# Patient Record
Sex: Male | Born: 1965 | Race: White | Hispanic: No | Marital: Married | Smoking: Former smoker
Health system: Southern US, Community
[De-identification: ages and names within clinical notes are randomized; demographics above are authoritative.]

## PROBLEM LIST (undated history)

## (undated) DIAGNOSIS — E785 Hyperlipidemia, unspecified: Secondary | ICD-10-CM

## (undated) DIAGNOSIS — R569 Unspecified convulsions: Secondary | ICD-10-CM

## (undated) DIAGNOSIS — R768 Other specified abnormal immunological findings in serum: Secondary | ICD-10-CM

## (undated) DIAGNOSIS — I639 Cerebral infarction, unspecified: Secondary | ICD-10-CM

## (undated) DIAGNOSIS — K219 Gastro-esophageal reflux disease without esophagitis: Secondary | ICD-10-CM

## (undated) DIAGNOSIS — G40909 Epilepsy, unspecified, not intractable, without status epilepticus: Secondary | ICD-10-CM

## (undated) DIAGNOSIS — Z8659 Personal history of other mental and behavioral disorders: Secondary | ICD-10-CM

## (undated) DIAGNOSIS — N289 Disorder of kidney and ureter, unspecified: Secondary | ICD-10-CM

## (undated) DIAGNOSIS — G510 Bell's palsy: Secondary | ICD-10-CM

## (undated) DIAGNOSIS — I10 Essential (primary) hypertension: Secondary | ICD-10-CM

## (undated) DIAGNOSIS — G43909 Migraine, unspecified, not intractable, without status migrainosus: Secondary | ICD-10-CM

## (undated) DIAGNOSIS — R6882 Decreased libido: Secondary | ICD-10-CM

## (undated) DIAGNOSIS — I671 Cerebral aneurysm, nonruptured: Secondary | ICD-10-CM

## (undated) HISTORY — DX: Hyperlipidemia, unspecified: E78.5

## (undated) HISTORY — DX: Epilepsy, unspecified, not intractable, without status epilepticus: G40.909

## (undated) HISTORY — PX: CEREBRAL ANGIOGRAM: SHX1326

## (undated) HISTORY — DX: Decreased libido: R68.82

## (undated) HISTORY — DX: Unspecified convulsions: R56.9

## (undated) HISTORY — DX: Essential (primary) hypertension: I10

## (undated) HISTORY — DX: Personal history of other mental and behavioral disorders: Z86.59

## (undated) HISTORY — PX: MANDIBLE FRACTURE SURGERY: SHX706

## (undated) HISTORY — PX: COLONOSCOPY: SHX174

## (undated) HISTORY — DX: Other specified abnormal immunological findings in serum: R76.8

---

## 2004-03-20 ENCOUNTER — Emergency Department: Payer: Self-pay | Admitting: Emergency Medicine

## 2004-07-29 ENCOUNTER — Emergency Department: Payer: Self-pay | Admitting: Emergency Medicine

## 2004-09-17 ENCOUNTER — Emergency Department: Payer: Self-pay | Admitting: Internal Medicine

## 2004-09-17 ENCOUNTER — Other Ambulatory Visit: Payer: Self-pay

## 2005-05-17 ENCOUNTER — Other Ambulatory Visit: Payer: Self-pay

## 2005-05-17 ENCOUNTER — Emergency Department: Payer: Self-pay | Admitting: Emergency Medicine

## 2005-11-19 ENCOUNTER — Emergency Department: Payer: Self-pay | Admitting: Emergency Medicine

## 2007-11-23 ENCOUNTER — Emergency Department: Payer: Self-pay | Admitting: Emergency Medicine

## 2010-10-05 ENCOUNTER — Ambulatory Visit: Payer: Self-pay | Admitting: Gastroenterology

## 2010-10-07 LAB — PATHOLOGY REPORT

## 2011-09-13 ENCOUNTER — Emergency Department: Payer: Self-pay | Admitting: Emergency Medicine

## 2012-05-03 ENCOUNTER — Telehealth: Payer: Self-pay

## 2012-05-03 ENCOUNTER — Emergency Department: Payer: Self-pay | Admitting: Emergency Medicine

## 2012-05-03 LAB — CBC
MCH: 31.3 pg (ref 26.0–34.0)
RBC: 4.6 10*6/uL (ref 4.40–5.90)
WBC: 6.7 10*3/uL (ref 3.8–10.6)

## 2012-05-03 LAB — CK TOTAL AND CKMB (NOT AT ARMC): CK, Total: 159 U/L (ref 35–232)

## 2012-05-03 LAB — TROPONIN I: Troponin-I: <0.02 ng/mL

## 2012-05-03 LAB — BASIC METABOLIC PANEL
EGFR (African American): >60
EGFR (Non-African Amer.): >60
Glucose: 87 mg/dL (ref 65–99)
Osmolality: 270 (ref 275–301)

## 2012-05-03 NOTE — Telephone Encounter (Signed)
Pt's wife called to say pt was in ER yesterday and was told to f/u with Dr. Mariah Milling tomm I spoke with Dr. Mariah Milling about this who says CP was very atypical and is ok to f/u in 1-2 weeks I will call pt to schedule

## 2012-05-03 NOTE — Telephone Encounter (Signed)
I called wife back She says she was told by ER MD to have pt f/u tomm with Dr. Mariah Milling I explained Dr. Mariah Milling did not feel tomm appt was necessary She verb understanding but is concerned b/c pt continues to have CP despite negative w/u in ER I worked pt in for appt with Dr. Mariah Milling Friday 1/17 at 3:15 Understanding verb

## 2012-05-05 ENCOUNTER — Ambulatory Visit (INDEPENDENT_AMBULATORY_CARE_PROVIDER_SITE_OTHER): Payer: BC Managed Care – PPO | Admitting: Cardiovascular Disease

## 2012-05-05 ENCOUNTER — Encounter: Payer: Self-pay | Admitting: Cardiovascular Disease

## 2012-05-05 VITALS — BP 120/80 | HR 67 | Ht 68.0 in | Wt 201.0 lb

## 2012-05-05 DIAGNOSIS — R079 Chest pain, unspecified: Secondary | ICD-10-CM

## 2012-05-05 DIAGNOSIS — R0602 Shortness of breath: Secondary | ICD-10-CM

## 2012-05-05 DIAGNOSIS — E785 Hyperlipidemia, unspecified: Secondary | ICD-10-CM

## 2012-05-05 DIAGNOSIS — Z87891 Personal history of nicotine dependence: Secondary | ICD-10-CM

## 2012-05-05 DIAGNOSIS — I10 Essential (primary) hypertension: Secondary | ICD-10-CM

## 2012-05-05 MED ORDER — FENOFIBRATE 160 MG PO TABS: 160.0000 mg | ORAL_TABLET | Freq: Every day | ORAL | Status: DC

## 2012-05-05 MED ORDER — LOVASTATIN 20 MG PO TABS: 20.0000 mg | ORAL_TABLET | Freq: Every day | ORAL | Status: DC

## 2012-05-05 MED ORDER — NITROGLYCERIN 0.4 MG SL SUBL: 0.4000 mg | SUBLINGUAL_TABLET | SUBLINGUAL | Status: DC | PRN

## 2012-05-05 NOTE — Progress Notes (Signed)
Patient ID: Hunter Weber, male    DOB: February 10, 1966, 47 y.o.   MRN: [id number]  HPI Comments: Mr. Hunter Weber is a very pleasant 38 year old gentleman with a 20 year smoking history who stopped 13 years ago with history of hypertension, hyperlipidemia, elevated triglycerides in the 500 range, depression, history of seizures who presents or ventilation of recent episodes of chest pain.  He reports that last week, proximally 4 days ago he had a short episode of chest pain while at work that was short in duration. The next day he had a second episode of left-sided chest pain it lasted approximately one hour. This was intense. He went to the emergency room for further evaluation.   Cardiac enzymes in the emergency room were negative x2. No significant EKG changes concerning for ischemia in the ER. Other workup was negative and he was discharged home with followup today.  No further episodes of chest pain since he was in the emergency room. He has not been back to work and has taken vacation. EKG shows normal sinus rhythm with rate 67 beats per minute with no significant ST or T wave changes Blood work from July 2013 shows total cholesterol 207, triglycerides 686, LDL 42, HDL 27  He reports that his brother recently died of a heart attack in his early 90s, father died of heart attack in his 22s. Both were long-time smokers   Outpatient Encounter Prescriptions as of 05/05/2012  Medication Sig Dispense Refill  . FLUoxetine (PROZAC) 20 MG tablet Take 20 mg by mouth daily.      Marland Kitchen levETIRAcetam (KEPPRA) 500 MG tablet Take 500 mg by mouth 2 (two) times daily.      Marland Kitchen losartan (COZAAR) 100 MG tablet Take 100 mg by mouth daily.      Marland Kitchen lovastatin (MEVACOR) 10 MG tablet Take 10 mg by mouth at bedtime.      . polyethylene glycol powder (MIRALAX) powder Take 17 g by mouth daily.      . nitroGLYCERIN (NITROSTAT) 0.4 MG SL tablet Place 1 tablet (0.4 mg total) under the tongue every 5 (five) minutes as needed for chest  pain.  25 tablet  6    Review of Systems  Constitutional: Negative.   HENT: Negative.   Eyes: Negative.   Respiratory: Negative.   Cardiovascular: Positive for chest pain.  Gastrointestinal: Negative.   Musculoskeletal: Negative.   Skin: Negative.   Neurological: Negative.   Hematological: Negative.   Psychiatric/Behavioral: Negative.   All other systems reviewed and are negative.    BP 120/80  Pulse 67  Ht 5\' 8"  (1.727 m)  Wt 201 lb (91.173 kg)  BMI 30.56 kg/m2  Physical Exam  Nursing note and vitals reviewed. Constitutional: He is oriented to person, place, and time. He appears well-developed and well-nourished.  HENT:  Head: Normocephalic.  Nose: Nose normal.  Mouth/Throat: Oropharynx is clear and moist.  Eyes: Conjunctivae normal are normal. Pupils are equal, round, and reactive to light.  Neck: Normal range of motion. Neck supple. No JVD present.  Cardiovascular: Normal rate, regular rhythm, S1 normal, S2 normal, normal heart sounds and intact distal pulses.  Exam reveals no gallop and no friction rub.   No murmur heard. Pulmonary/Chest: Effort normal and breath sounds normal. No respiratory distress. He has no wheezes. He has no rales. He exhibits no tenderness.  Abdominal: Soft. Bowel sounds are normal. He exhibits no distension. There is no tenderness.  Musculoskeletal: Normal range of motion. He exhibits no edema and  no tenderness.  Lymphadenopathy:    He has no cervical adenopathy.  Neurological: He is alert and oriented to person, place, and time. Coordination normal.  Skin: Skin is warm and dry. No rash noted. No erythema.  Psychiatric: He has a normal mood and affect. His behavior is normal. Judgment and thought content normal.           Assessment and Plan

## 2012-05-05 NOTE — Assessment & Plan Note (Signed)
We have complemented him on his smoking cessation over 10 years ago.

## 2012-05-05 NOTE — Assessment & Plan Note (Signed)
Cholesterol is at goal on the current lipid regimen. No changes to the medications were made.  

## 2012-05-05 NOTE — Assessment & Plan Note (Signed)
Recent episodes of atypical chest pain. He does have several risk factors and a very strong family history. We have discussed the options with him and we will continue aggressive cholesterol/lipid management. Treadmill stress test will be ordered and done today.

## 2012-05-05 NOTE — Patient Instructions (Addendum)
Stress test was normal with good exercise tolerance, no ischemia No further testing needed at this time  Increase her lovastatin to 20 mg daily, start fenofibrate 1 tab daily Start aspirin 81 mg daily If you have additional episodes of chest pain, please call the office

## 2012-05-05 NOTE — Assessment & Plan Note (Signed)
Cholesterol continues to be mildly elevated, triglycerides very high. We have suggested he increase his lovastatin to 20 mg daily and start fenofibrate 160 mg daily for elevated triglycerides. We have encouraged him to continue on his fish oil.

## 2012-05-05 NOTE — Procedures (Signed)
Exercise Treadmill Test  Treadmill ordered for recent epsiodes of chest pain.  Resting EKG shows NSR with rate of 64 bpm, no significant ST or T wave changes Resting blood pressure of 120/80. Stand bruce protocal was used.  Patient exercised for 8 min 04 sec,  Peak heart rate of 186 bpm.  This was 90% of the maximum predicted heart rate (target heart rate 150). Achieved 10.1 METS No symptoms of chest pain or lightheadedness were reported at peak stress or in recovery.  Peak Blood pressure recorded was 188/84 Heart rate at 3 minutes in recovery was  93 bpm.  FINAL IMPRESSION: Normal exercise stress test. No significant EKG changes concerning for ischemia. Excellent exercise tolerance.

## 2012-05-05 NOTE — Patient Instructions (Addendum)
You are doing well. Please increase the lovastatin to 20 mg daily for cholesterol Please start fenofibrate one a day for high triglycerides Continue aspirin 81 mg daily  We will do a treadmill today for chest pain  Please call us if you have new issues that need to be addressed before your next appt.  Followup in 1 year Please call the office for worsening chest pain

## 2013-05-07 ENCOUNTER — Other Ambulatory Visit: Payer: Self-pay

## 2013-05-07 MED ORDER — FENOFIBRATE 160 MG PO TABS: 160.0000 mg | ORAL_TABLET | Freq: Every day | ORAL | Status: DC

## 2013-05-07 NOTE — Telephone Encounter (Signed)
Refill sent for fenofibrate 160 mg take one tablet daily.

## 2013-12-31 DIAGNOSIS — K219 Gastro-esophageal reflux disease without esophagitis: Secondary | ICD-10-CM | POA: Insufficient documentation

## 2013-12-31 DIAGNOSIS — G40209 Localization-related (focal) (partial) symptomatic epilepsy and epileptic syndromes with complex partial seizures, not intractable, without status epilepticus: Secondary | ICD-10-CM | POA: Insufficient documentation

## 2013-12-31 DIAGNOSIS — F329 Major depressive disorder, single episode, unspecified: Secondary | ICD-10-CM | POA: Insufficient documentation

## 2013-12-31 DIAGNOSIS — F32A Depression, unspecified: Secondary | ICD-10-CM | POA: Insufficient documentation

## 2014-03-18 ENCOUNTER — Emergency Department: Payer: Self-pay | Admitting: Emergency Medicine

## 2014-03-18 LAB — BASIC METABOLIC PANEL
ANION GAP: 5 — AB (ref 7–16)
BUN: 17 mg/dL (ref 7–18)
CHLORIDE: 105 mmol/L (ref 98–107)
CO2: 29 mmol/L (ref 21–32)
CREATININE: 1.67 mg/dL — AB (ref 0.60–1.30)
Calcium, Total: 9.1 mg/dL (ref 8.5–10.1)
GFR CALC AF AMER: 56 — AB
GFR CALC NON AF AMER: 47 — AB
GLUCOSE: 92 mg/dL (ref 65–99)
Osmolality: 279 (ref 275–301)
Potassium: 4 mmol/L (ref 3.5–5.1)
Sodium: 139 mmol/L (ref 136–145)

## 2014-03-18 LAB — CBC WITH DIFFERENTIAL/PLATELET
BASOS ABS: 0.1 10*3/uL (ref 0.0–0.1)
Basophil %: 0.7 %
EOS PCT: 1.1 %
Eosinophil #: 0.1 10*3/uL (ref 0.0–0.7)
HCT: 44.3 % (ref 40.0–52.0)
HGB: 15.3 g/dL (ref 13.0–18.0)
LYMPHS ABS: 2 10*3/uL (ref 1.0–3.6)
Lymphocyte %: 23.5 %
MCH: 32 pg (ref 26.0–34.0)
MCHC: 34.5 g/dL (ref 32.0–36.0)
MCV: 93 fL (ref 80–100)
Monocyte #: 0.7 x10 3/mm (ref 0.2–1.0)
Monocyte %: 8.6 %
NEUTROS ABS: 5.5 10*3/uL (ref 1.4–6.5)
Neutrophil %: 66.1 %
PLATELETS: 189 10*3/uL (ref 150–440)
RBC: 4.78 10*6/uL (ref 4.40–5.90)
RDW: 13.1 % (ref 11.5–14.5)
WBC: 8.4 10*3/uL (ref 3.8–10.6)

## 2014-04-22 ENCOUNTER — Ambulatory Visit: Payer: Self-pay | Admitting: Neurology

## 2014-05-22 DIAGNOSIS — N183 Chronic kidney disease, stage 3 unspecified: Secondary | ICD-10-CM | POA: Insufficient documentation

## 2014-08-14 ENCOUNTER — Inpatient Hospital Stay
Admit: 2014-08-14 | Disposition: A | Discharge status: Home / Self Care | Payer: Self-pay | Attending: Internal Medicine | Admitting: Internal Medicine

## 2014-08-14 DIAGNOSIS — I639 Cerebral infarction, unspecified: Secondary | ICD-10-CM

## 2014-08-14 LAB — COMPREHENSIVE METABOLIC PANEL
ALBUMIN: 4.4 g/dL
ALK PHOS: 56 U/L
ANION GAP: 6 — AB (ref 7–16)
AST: 25 U/L
BUN: 14 mg/dL
Bilirubin,Total: 0.8 mg/dL
CHLORIDE: 105 mmol/L
Calcium, Total: 9.3 mg/dL
Co2: 28 mmol/L
Creatinine: 1.47 mg/dL — ABNORMAL HIGH
EGFR (African American): >60
EGFR (Non-African Amer.): 55 — ABNORMAL LOW
Glucose: 81 mg/dL
Potassium: 3.8 mmol/L
SGPT (ALT): 22 U/L
Sodium: 139 mmol/L
Total Protein: 7.1 g/dL

## 2014-08-14 LAB — APTT: Activated PTT: 31 secs (ref 23.6–35.9)

## 2014-08-14 LAB — CBC WITH DIFFERENTIAL/PLATELET
BASOS PCT: 0.6 %
Basophil #: 0 10*3/uL (ref 0.0–0.1)
EOS PCT: 1.8 %
Eosinophil #: 0.1 10*3/uL (ref 0.0–0.7)
HCT: 43.7 % (ref 40.0–52.0)
HGB: 15.2 g/dL (ref 13.0–18.0)
LYMPHS ABS: 2.2 10*3/uL (ref 1.0–3.6)
LYMPHS PCT: 28.3 %
MCH: 31 pg (ref 26.0–34.0)
MCHC: 34.9 g/dL (ref 32.0–36.0)
MCV: 89 fL (ref 80–100)
MONO ABS: 0.8 x10 3/mm (ref 0.2–1.0)
Monocyte %: 10.6 %
NEUTROS PCT: 58.7 %
Neutrophil #: 4.5 10*3/uL (ref 1.4–6.5)
Platelet: 198 10*3/uL (ref 150–440)
RBC: 4.91 10*6/uL (ref 4.40–5.90)
RDW: 13.3 % (ref 11.5–14.5)
WBC: 7.7 10*3/uL (ref 3.8–10.6)

## 2014-08-14 LAB — URINALYSIS, COMPLETE
BACTERIA: NONE SEEN
Bilirubin,UR: NEGATIVE
Blood: NEGATIVE
GLUCOSE, UR: NEGATIVE mg/dL (ref 0–75)
Ketone: NEGATIVE
LEUKOCYTE ESTERASE: NEGATIVE
Nitrite: NEGATIVE
PROTEIN: NEGATIVE
Ph: 6 (ref 4.5–8.0)
RBC,UR: NONE SEEN /HPF (ref 0–5)
Specific Gravity: 1.014 (ref 1.003–1.030)
Squamous Epithelial: NONE SEEN
WBC UR: NONE SEEN /HPF (ref 0–5)

## 2014-08-14 LAB — PROTIME-INR
INR: 1.2
Prothrombin Time: 14.9 secs

## 2014-08-14 LAB — TROPONIN I: Troponin-I: <0.03 ng/mL

## 2014-08-18 NOTE — Consult Note (Addendum)
PATIENT NAME:  Hunter Weber, Hunter Weber MR#:  [id number] DATE OF BIRTH:  07-22-1965  DATE OF CONSULTATION:  08/15/2014  REFERRING PHYSICIAN:   CONSULTING PHYSICIAN:  Leotis Pain, MD  REASON FOR CONSULTATION: Left facial numbness and numbness in the first 2 fingers of his left upper extremity.   HISTORY OF PRESENT ILLNESS: This is a 49-old gentleman recently discharged from [facility] for history of diffuse pressure-like headache. He was admitted there with rule out subarachnoid hemorrhage, status post multiple interventions including angiogram, lumbar puncture and CT. At that time, imaging only showed left MCA bifurcation 3 mm aneurysm. No signs of a subarachnoid hemorrhage. The patient's headache has resolved, and the patient was discharged home. Soon after discharge, the patient was found to have numbness on the left side of his lip and his 2 fingers, on his thumb and his forefinger, on the left upper extremity. Symptoms still persisted. Status post MRI, the patient has a right PCA/right MCA infarct, but the patient has a fetal PCA meaning it is coming from anterior circulation.  PAST MEDICAL HISTORY: Reviewed. Past medical history of seizure disorder, hypertension, and depression.   HOME MEDICATIONS: Have been reviewed, on Keppra 1 gram daily.  REVIEW OF SYSTEMS: No shortness of breath. No chest pain. No abdominal pain. Positive numbness on the left side as described above. Positive history of depression. No heat or cold intolerance.  IMAGING: As described above, right PCA/right MCA infarct, but they all come from the same anterior circulation.   LABORATORY WORKUP: Has been reviewed.  NEUROLOGIC EVALUATION: The patient is alert, awake and oriented to time, place, location and the reason why he is in the supper. Facial sensation decreased on the left side of the face. Motor is intact. Tongue is midline. Uvula elevates symmetrically. Shoulder shrug intact. Motor strength 5/5 bilateral upper and lower  extremities. Slight decreased sensation on the first 2 fingers on the left upper extremity. Coordination: Finger-to-nose intact. Gait: The patient is able to ambulate without any assistance.   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 His right posterior cerebral artery is fetal origin, meaning it is coming from anterior circulation, so I do not think this stroke is cardioembolic in nature.   PLAN: Aspirin to be started. PT/OT and likely discharge planning. I do not think he needs any further imaging. His seizures are well controlled. He is on Keppra a gram a twice a day.   This case was discussed with the patient and the patient's wife who was at bedside. Thank you, it was a pleasure seeing this patient.    ____________________________ Leotis Pain, MD yz:TM D: 08/15/2014 13:41:50 ET T: 08/15/2014 15:47:30 ET JOB#: [id number]  cc: Leotis Pain, MD, <Dictator> Leotis Pain MD ELECTRONICALLY SIGNED 09/05/2014 17:14

## 2014-08-18 NOTE — H&P (Addendum)
PATIENT NAME:  Hunter Weber, Hunter Weber MR#:  [id number] DATE OF BIRTH:  09/11/1965  DATE OF ADMISSION:  08/14/2014  PRIMARY CARE PHYSICIAN:  Dr. Ellison Hughs.    EMERGENCY ROOM PHYSICIAN:  Dr. Reita Cliche.   CHIEF COMPLAINT: Left side facial numbness.   HISTORY OF PRESENT ILLNESS: The patient is a 49 year old male brought in because of left facial numbness mainly involving the left upper lip and lower lip. The patient's symptoms started yesterday. The patient had a cerebral angiogram at [facility] and the symptoms started after the angiogram and symptoms started in the recovery room.  The  patient had severe headache a week ago, went to [facility], had CT of the head, LP, which were negative and he also had MRI of the brain which showed around 3 mm aneurysm which was incidental finding and  located at left MCA bifurcation. For followup of that he had an angiogram yesterday and in the recovery room he felt numbness of the left face and left oral area. The patient was discharged from there and the patient continued to have numbness on the left upper and lower lip and weak all over the body. Concerning this he was brought in here. His girlfriend also noticed that he was having some slurred speech. No headache. No blurred vision. No weakness of hands or legs in addition to like his regular weakness. Head CT showed right thalamus infarct.  Dr. Reita Cliche spoke with Dr. Mina Marble at [facility]. He suggested that because the aneurysm is only 3 mm there  is no treatment recommended and he suggested to treat it like a regular stroke.  He said if the aneurysm is more than 5 mm or greater then the patient needs surgery, otherwise he will not need surgery and he needs regular scheduled followups. The patient denies headache at this time, and has only numbness in the upper lip and lower lip on the left side.   PAST MEDICAL HISTORY: Significant for seizure disorder, hypertension, and depression.    MEDICATIONS:  Fenofibrate 150 mg p.o. daily, fluoxetine 20 mg daily,  krill oil 1 capsule daily, Keppra 1 gram p.o. b.i.d., losartan 100 mg p.o. daily, lovastatin 10 mg p.o. daily, Zyrtec 1 tablet daily.   PAST SURGICAL HISTORY: None.   SOCIAL HISTORY: The patient does not smoke. He drinks heavy on the weekends. No drugs.  FAMILY HISTORY: Significant for brothers and sisters who have heart disease.   MEDICATIONS:  Fenofibrate 150 mg p.o. daily, fluoxetine 20 mg p.o. daily, krill oil 1 capsule p.o. daily, Keppra 1 gram p.o. b.i.d., losartan 100 mg p.o. daily, lovastatin 10 mg p.o. daily, Zyrtec D 1 tablet daily.   REVIEW OF SYSTEMS:    CONSTITUTIONAL: No fever. No fatigue.   EYES:  The patient has no blurred vision.  Has history of right facial palsy years ago and has no residual deficits.  EARS, NOSE, AND THROAT: No tinnitus. No epistaxis. No difficulty swallowing.  RESPIRATIONS: No cough. No wheezing.  CARDIOVASCULAR:  No chest pain.  No orthopnea. No PND.  GASTROINTESTINAL: No nausea. No vomiting. No abdominal pain.  GENITOURINARY: No dysuria.  ENDOCRINE: No polyuria or nocturia.   INTEGUMENTARY: No skin rash.  MUSCULOSKELETAL: No joint pain.  NEUROLOGIC: The patient has numbness in the perioral area on the left side upper and lower lip. No weakness noted. The patient has no dysarthria. History of epilepsy present.  No tremors. No dementia. Had headache a week ago. No migraines.  PSYCHIATRIC:  Has a history of depression.  PHYSICAL EXAMINATION:  VITAL SIGNS: Temperature 98.3 Fahrenheit, heart rate 85, blood pressure 122/88, saturations 99% on room air.  GENERAL: Well-developed, well-nourished male, not in distress.  HEAD: Normocephalic, atraumatic.  EYES: Pupils equal, reacting to light. Extraocular movements are intact.  NOSE:  No nasal lesions. No drainage.  EARS: No drainage.   MOUTH:  No lesions.  NECK: Supple. No JVD. No carotid bruit. Thyroid in the midline.  RESPIRATORY: Good respiratory effort. Clear to auscultation. No wheeze noted.   CARDIOVASCULAR: S1, S2 regular. No murmurs. The patient has no peripheral edema.  GASTROINTESTINAL: Abdomen is soft, nontender, nondistended. Bowel sounds present.  EXTREMITIES: No extremity edema. No cyanosis, no clubbing.  MUSCULOSKELETAL: The patient has no pathology of digits or nails and strength and tone are equal bilaterally.  NEUROLOGIC: The patient is alert, awake, oriented. Cranial nerves II through XII intact. Power 5/5 upper and lower extremities. Sensation, decreased sensation on the left side of upper and lower extremities lips, but the sensations in the rest of the areas are normal to touch. Power 5 out of 5 in the upper and lower extremities. No hemiplegia. Cerebellar with finger-nose test is intact.   PSYCHIATRIC: Oriented to time, place, person.   LABORATORY DATA:  CT head shows recent infarct in the superior right thalamus. Troponin less than 0.03. Electrolytes, sodium 139, potassium is 3.8, chloride is 105, bicarbonate 28, BUN 14, creatinine 1.47, glucose 81.   CBC, WBCs 7.7, hemoglobin 15.2, hematocrit 43.7, platelets 198,000.   EKG, normal sinus rhythm at 81 beats per minute, no ST-T changes.   ASSESSMENT AND PLAN:  1.  The patient is a 49 year old male with acute right thalamus infarct causing left oral numbness.  The patient is admitted to medical service. Get ultrasound and echocardiogram, start him on aspirin, and check fasting lipids as well.  2.  Recent severe headache evaluated extensively at [facility] including CT head, MRI of the brain, cerebral angiogram. The patient has a 3 mm aneurysm and Dr. Mina Marble, the neurologist has been contacted and he suggested that we should treat it like acute stroke, so the patient will be admitted here. We will check ultrasound of the carotids and echocardiogram and follow stroke protocol. The patient is out of 90 minute window so we will continue aspirin alone. The family is aware of all the treatment plan.  We will get records of MRI of the  brain that was done a week ago and we will check MRI of the brain again and see.  Get neurological checks and obtain neurology consult.  3.  History of seizures. Continue him on Keppra.  4.  History of depression. Continue fluoxetine.  5.  History of hyperlipidemia. Continue Tricor and check fasting lipids.  6.  Hypertension. The patient is admitted for acute stroke, and allow permissive hypertension with blood pressure of 220/110 and hold off on losartan at this time.   TIME SPENT: 55 minutes. Discussed the plan with the patient's family.      ____________________________ Epifanio Lesches, MD sk:bu D: 08/14/2014 16:29:39 ET T: 08/14/2014 16:52:03 ET JOB#: [id number]  cc: Epifanio Lesches, MD, <Dictator> Epifanio Lesches MD ELECTRONICALLY SIGNED 08/20/2014 23:28

## 2014-08-18 NOTE — Discharge Summary (Signed)
Dates of Admission and Diagnosis:  Date of Admission 14-Aug-2014   Date of Discharge 15-Aug-2014   Admitting Diagnosis left numbness   Final Diagnosis 1. Acute Cerebrovascular accident Right thalamus and left thalamus. Possibly post-angiogram 2. Left MCA aneurysm. 3. hypertension  4. Seizures 5. Depression    Chief Complaint/History of Present Illness HISTORY OF PRESENT ILLNESS: This is a 48-old gentleman recently discharged from [facility] for history of diffuse pressure-like headache. He was admitted there with rule out subarachnoid hemorrhage, status post multiple interventions including angiogram, lumbar puncture and CT. At that time, imaging only showed left MCA bifurcation 3 mm aneurysm. No signs of a subarachnoid hemorrhage. The patient's headache has resolved, and the patient was discharged home. Soon after discharge, the patient was found to have numbness on the left side of his lip and his 2 fingers, on his thumb and his forefinger, on the left upper extremity. Symptoms still persisted. Status post MRI, the patient has a right PCA/right MCA infarct, but the patient has a fetal PCA meaning it is coming from anterior circulation   Allergies:  Penicillin: Anaphylaxis  Lisinopril: Headaches  Pertinent Past History:  Pertinent Past History hypertension Seizure Depression 3 mm L MCA aneurysm   Hospital Course:  Hospital Course Admitted to r/o Cerebrovascular accident. CT head was normal. MRI brain showed acute cva of Left and R thalamus. Possibility of this being a stroke from recent arteriogram at [facility]. Started on ASA, Statin Counselled. Seen by neurology. Echo and carotids did not show anything significant. No focal weakness and did not need PT/OT> Symptoms almost resolved and discharged home. Needs OP f/u with his neurologist for MCA naeurysm f/u.  Time spent on d/c 40 minutes   Condition on Discharge Fair   Code Status:  Code Status Full Code   PHYSICAL EXAM ON  DISCHARGE:  Physical Exam:  GEN thin   NECK supple  No masses   RESP normal resp effort  clear BS   NEURO negative rigidity, negative tremor, motor/sensory function intact   DISCHARGE INSTRUCTIONS HOME MEDS:  Medication Reconciliation: Patient's Home Medications at Discharge:     Medication Instructions  fluoxetine 20 mg oral capsule  1 cap(s) orally once a day (at bedtime)   losartan 100 mg oral tablet  1 tab(s) orally once a day (at bedtime)   fenofibrate 160 mg oral tablet  1 tab(s) orally once a day (at bedtime)   krill oil- capsule  1 cap(s) orally once a day (in the morning)   levetiracetam 1000 mg oral tablet  1 tab(s) orally 2 times a day   lovastatin 10 mg oral tablet  1 tab(s) orally once a day (at bedtime)   zyrtec-d  1 tab(s) orally once a day (in the morning)   aspirin 81 mg oral delayed release tablet  1 tab(s) orally once a day     Physician's Instructions:  Diet Low Sodium  Low Fat, Low Cholesterol   Activity Limitations As tolerated   Return to Work Not Applicable   Time frame for Follow Up Appointment 2-4 weeks  Dr. Manuella Ghazi( neurology)   Time frame for Follow Up Appointment 1-2 weeks  primary care provider   Electronic Signatures: Hunter Weber, Hunter Weber (MD)  (Signed 29-Apr-16 16:27)  Authored: ADMISSION DATE AND DIAGNOSIS, CHIEF COMPLAINT/HPI, Allergies, PERTINENT PAST HISTORY, HOSPITAL COURSE, PHYSICAL EXAM ON DISCHARGE, DISCHARGE INSTRUCTIONS HOME MEDS, PATIENT INSTRUCTIONS   Last Updated: 29-Apr-16 16:27 by Alba Destine (MD)

## 2014-08-29 DIAGNOSIS — I671 Cerebral aneurysm, nonruptured: Secondary | ICD-10-CM | POA: Insufficient documentation

## 2014-08-29 DIAGNOSIS — I63511 Cerebral infarction due to unspecified occlusion or stenosis of right middle cerebral artery: Secondary | ICD-10-CM | POA: Insufficient documentation

## 2014-09-04 ENCOUNTER — Encounter: Payer: Self-pay | Admitting: Physical Therapy

## 2014-09-04 ENCOUNTER — Ambulatory Visit: Payer: BLUE CROSS/BLUE SHIELD | Attending: Neurology | Admitting: Physical Therapy

## 2014-09-04 DIAGNOSIS — R278 Other lack of coordination: Secondary | ICD-10-CM

## 2014-09-04 DIAGNOSIS — R531 Weakness: Secondary | ICD-10-CM | POA: Diagnosis not present

## 2014-09-04 DIAGNOSIS — R279 Unspecified lack of coordination: Secondary | ICD-10-CM | POA: Diagnosis not present

## 2014-09-04 NOTE — Addendum Note (Signed)
Addended by: Alanson Puls on: 09/04/2014 02:22 PM   Modules accepted: Orders

## 2014-09-04 NOTE — Therapy (Addendum)
[place] MAIN [facility] SERVICES [address] [place], [place], [address] Phone: [phone]   Fax:  [phone]  Physical Therapy Evaluation  Patient Details  Name: Hunter Weber MRN: [id number] Date of Birth: 1965/09/05 Referring Provider:  Vladimir Crofts, MD  Encounter Date: 09/04/2014      PT End of Session - 09/04/14 1126    Visit Number 1   Number of Visits 9   Date for PT Re-Evaluation 10/30/14   PT Start Time 0905   PT Stop Time 0955   PT Time Calculation (min) 50 min   Activity Tolerance Patient tolerated treatment well   Behavior During Therapy [facility] for tasks assessed/performed      Past Medical History  Diagnosis Date  . Hyperlipidemia   . Hypertension   . Other convulsions   . Seizure disorder   . History of depression   . Low libido   . Positive ANA (antinuclear antibody)     Past Surgical History  Procedure Laterality Date  . Mandible fracture surgery      There were no vitals filed for this visit.  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            [facility] PT Assessment - 09/04/14 0001    Assessment   Medical Diagnosis CVA   Onset Date 08/13/14   Next MD Visit june 6 , 2016   Prior Therapy n   Precautions   Precautions None   Restrictions   Weight Bearing Restrictions No   Other Position/Activity Restrictions na   Balance Screen   Has the patient fallen in the past 6 months No   Has the patient had a decrease in activity level because of a fear of falling?  Yes   Is the patient reluctant to leave their home because of a fear of falling?  No   Home Environment   Living Enviornment Private residence   Living Arrangements Spouse/significant other   Available Help at Discharge Available 24 hours/day   Type of [place] Access Level entry   [place] None   Prior Function   Level of Independence Independent with basic ADLs   Vocation Full time employment   Vocation Requirements standing all day   Leisure na   Cognition   Overall Cognitive Status Impaired/Different from baseline   Area of Impairment Safety/judgement   Sensation   Light Touch Impaired Detail   Light Touch Impaired Details --  Right index finger and thumb impaired light and deep    Additional Comments --  Patient has numbness on tip of tung and and left side of fac   Coordination   Finger Nose Finger Test --  impaired   Heel Shin Test impaired   9 Hole Peg Test --  25.08 sec right , 41.50 sec   Functional Tests   Functional tests Sit to Stand   Sit to Stand   Comments s to s is 12.27   Tone   Assessment Location --  WFL   ROM / Strength   AROM / PROM / Strength AROM  4/5 l hip,knee ankle, shoulder,forearm sup/pron,wrist 3/5   AROM   Overall AROM  --  32 right grip, 22 left grip   Ambulation/Gait  Gait Comments --  Pt has slow gait speed and occassional foot drop   6 Minute Walk- Baseline   6 Minute Walk- Baseline yes  1475 feet   Static Standing Balance   Static Standing - Comment/# of Minutes --  Patient is unable to perform static tandem standing   Standardized Balance Assessment   Standardized Balance Assessment Timed Up and Go Test  see functional sit to stand test      gait speed 10 MW 1.31 m/sec                     PT Education - 09/04/14 1125    Education provided Yes   Person(s) Educated Patient   Methods Explanation;Demonstration;Tactile cues   Comprehension Returned demonstration;Verbalized understanding;Verbal cues required             PT Long Term Goals - 09/04/14 1409    PT LONG TERM GOAL #1   Title Patient will be independent in home exercise program to improve strength/mobility for better functional independence with ADLs 10/30/14   Status New   PT LONG TERM GOAL #2   Title   Patient (< 36 years old) will complete five times sit to stand test in < 10 seconds indicating an increased LE strength and improved balance 10/30/14   Status New   PT LONG TERM GOAL #3   Title Patient will tolerate 5 seconds of tandem stand stance without loss of balance to improve ability to get in and out of shower safely 10/30/14   Status New               Plan - 09/04/14 1127    Clinical Impression Statement This 49 yr old male prsents with Dx of CVA and has deficits including  decreasead :sensation,strenghth,  coordination of LLE and LUE, dynamic stantding balance, and impaired gait. He will benefit from skilled PT to improve balance, strenght and gait and coordination.    Pt will benefit from skilled therapeutic intervention in order to improve on the following deficits Abnormal gait;Decreased coordination;Difficulty walking;Decreased endurance;Decreased safety awareness;Impaired sensation;Decreased balance;Impaired UE functional use;Decreased mobility;Decreased strength   Rehab Potential Good   PT Frequency 2x / week   PT Duration 8 weeks   PT Treatment/Interventions Gait training;Neuromuscular re-education;Stair training;Patient/family education;Therapeutic activities;Cryotherapy;Therapeutic exercise;Electrical Stimulation;Manual techniques;Moist Heat;Balance training   PT Next Visit Plan balance and strength training   PT Home Exercise Plan instructed in hand putty exericses   Consulted and Agree with Plan of Care Patient;Family member/caregiver         Problem List Patient Active Problem List   Diagnosis Date Noted  . Chest pain 05/05/2012  . Hyperlipidemia 05/05/2012  . Essential hypertension 05/05/2012  . Smoking history 05/05/2012    Alanson Puls 09/04/2014, 2:17 PM  [place] MAIN [facility] SERVICES [address] [place], [place], [address] Phone: [phone]   Fax:  [phone]

## 2014-09-09 ENCOUNTER — Ambulatory Visit: Payer: BLUE CROSS/BLUE SHIELD | Admitting: Physical Therapy

## 2014-09-09 ENCOUNTER — Encounter: Payer: Self-pay | Admitting: Physical Therapy

## 2014-09-09 DIAGNOSIS — R279 Unspecified lack of coordination: Secondary | ICD-10-CM | POA: Diagnosis not present

## 2014-09-09 DIAGNOSIS — R278 Other lack of coordination: Secondary | ICD-10-CM

## 2014-09-09 NOTE — Therapy (Signed)
[place] MAIN [facility] SERVICES [address] [place], [place], [address] Phone: [phone]   Fax:  [phone]  Physical Therapy Treatment  Patient Details  Name: Hunter Weber MRN: [id number] Date of Birth: [birth year] Referring Provider:  Vladimir Crofts, MD  Encounter Date: 09/09/2014      PT End of Session - 09/09/14 1520    Visit Number 2   Number of Visits 10   Date for PT Re-Evaluation 10/30/14   PT Start Time 0305   PT Stop Time 0350   PT Time Calculation (min) 45 min   Activity Tolerance Patient tolerated treatment well   Behavior During Therapy [facility] for tasks assessed/performed      Past Medical History  Diagnosis Date  . Hyperlipidemia   . Hypertension   . Other convulsions   . Seizure disorder   . History of depression   . Low libido   . Positive ANA (antinuclear antibody)     Past Surgical History  Procedure Laterality Date  . Mandible fracture surgery      There were no vitals filed for this visit.  Visit Diagnosis:  Decreased coordination      Subjective Assessment - 09/09/14 1518    Subjective Patient says that he is doing ok.       Coordination exercises for LUE including shuffling cards, and dealing cards, picking up round dowles with gripper and managing small pieces of odd shapped objects and getting them into wholes. Patient was seen for high level balance activities including tandem standing on 1/2 foam catching a ball and tossing balloon. Continues to have balance deficits typical with diagnosis. Patient performs intermediate level exercises without pain behaviors and needs verbal cuing for postural alignment and head positioning                           PT Education - 09/09/14 1519    Education provided Yes   Education Details HEP progression with theraputty and coordination hand exercises.   Person(s) Educated Patient   Methods Explanation;Demonstration;Tactile cues   Comprehension Returned demonstration;Verbal cues required             PT Long Term Goals - 09/04/14 1409    PT LONG TERM GOAL #1   Title Patient will be independent in home exercise program to improve strength/mobility for better functional independence with ADLs 10/30/14   Status New   PT LONG TERM GOAL #2   Title  Patient (< 40 years old) will complete five times sit to stand test in < 10 seconds indicating an increased LE strength and improved balance 10/30/14   Status New   PT LONG TERM GOAL #3   Title Patient will tolerate 5 seconds of tandem stand stance without loss of balance to improve ability to get in and out of shower safely 10/30/14   Status New               Plan - 09/09/14 1532    Clinical Impression Statement Fatigue with sit to stand but demonstrating more control, Increase weight for standing exercises   Pt will benefit from skilled therapeutic intervention in order to improve on the following deficits Abnormal gait;Decreased coordination;Difficulty walking;Decreased endurance;Decreased safety awareness;Impaired sensation;Decreased balance;Impaired UE functional use;Decreased mobility;Decreased strength   Rehab Potential Good   PT Frequency 2x / week   PT Duration 8 weeks   PT Treatment/Interventions Gait training;Neuromuscular re-education;Stair training;Patient/family education;Therapeutic activities;Cryotherapy;Therapeutic exercise;Electrical Stimulation;Manual  techniques;Moist Heat;Balance training   PT Next Visit Plan balance and strength training   PT Home Exercise Plan instructed in hand putty exericses   Consulted and Agree with Plan of Care Patient;Family member/caregiver        Problem List Patient Active Problem List   Diagnosis Date Noted  . Chest pain 05/05/2012  . Hyperlipidemia 05/05/2012  . Essential hypertension 05/05/2012  . Smoking history 05/05/2012    Alanson Puls 09/09/2014, 4:10 PM  [place] MAIN [facility] SERVICES [address] [place], [place], [address] Phone: [phone]   Fax:  [phone]

## 2014-09-12 ENCOUNTER — Ambulatory Visit: Payer: BLUE CROSS/BLUE SHIELD | Admitting: Physical Therapy

## 2014-09-12 ENCOUNTER — Encounter: Payer: Self-pay | Admitting: Physical Therapy

## 2014-09-12 DIAGNOSIS — R278 Other lack of coordination: Secondary | ICD-10-CM

## 2014-09-12 DIAGNOSIS — R279 Unspecified lack of coordination: Secondary | ICD-10-CM | POA: Diagnosis not present

## 2014-09-12 NOTE — Therapy (Signed)
[place] MAIN [facility] SERVICES [address] [place], [place], [address] Phone: [phone]   Fax:  [phone]  Physical Therapy Treatment  Patient Details  Name: Hunter Weber MRN: [id number] Date of Birth: 27-Feb-1966 Referring Provider:  Vladimir Crofts, MD  Encounter Date: 09/12/2014      PT End of Session - 09/12/14 1554    Visit Number 3   Number of Visits 10   Date for PT Re-Evaluation 10/30/14   PT Start Time 0305   PT Stop Time 0400   PT Time Calculation (min) 55 min      Past Medical History  Diagnosis Date  . Hyperlipidemia   . Hypertension   . Other convulsions   . Seizure disorder   . History of depression   . Low libido   . Positive ANA (antinuclear antibody)     Past Surgical History  Procedure Laterality Date  . Mandible fracture surgery      There were no vitals filed for this visit.  Visit Diagnosis:  Decreased coordination      Subjective Assessment - 09/12/14 1553    Subjective Patient fell down yesterday trying to step over a door threshold.              Coordination exercises for LUE including shuffling cards, and dealing cards, pinching clothes pins on ruler, screwing on nuts and bolts.  Patient was seen for high level balance activities including tandem standing on 1/2 foam catching a ball and tossing balloon, weight shifting on disk and 1/2 foam. bench press on left 75 lbs, 120 lbs RLE x 20 x 3. Continues to have balance deficits typical with diagnosis. Patient performs intermediate level exercises without pain behaviors and needs verbal cuing for postural alignment and head positioning.                                 PT Education - 09/12/14 1553    Education provided Yes   Education Details Standing balance training.    Person(s) Educated Patient   Methods Explanation;Demonstration;Tactile cues   Comprehension Returned demonstration;Verbalized understanding;Verbal  cues required             PT Long Term Goals - 09/04/14 1409    PT LONG TERM GOAL #1   Title Patient will be independent in home exercise program to improve strength/mobility for better functional independence with ADLs 10/30/14   Status New   PT LONG TERM GOAL #2   Title  Patient (< 15 years old) will complete five times sit to stand test in < 10 seconds indicating an increased LE strength and improved balance 10/30/14   Status New   PT LONG TERM GOAL #3   Title Patient will tolerate 5 seconds of tandem stand stance without loss of balance to improve ability to get in and out of shower safely 10/30/14   Status New               Plan - 09/12/14 1555    Clinical Impression Statement Patient has weakness in LLE and LUE and has decreased coordination to L hand. Patient will continue to benefit from skilled PT to improve mobility and strength and balance.    Pt will benefit from skilled therapeutic intervention in order to improve on the following deficits Abnormal gait;Decreased coordination;Difficulty walking;Decreased endurance;Decreased safety awareness;Impaired sensation;Decreased balance;Impaired UE functional use;Decreased mobility;Decreased strength   Rehab Potential  Good   PT Frequency 2x / week   PT Duration 8 weeks   PT Treatment/Interventions Gait training;Neuromuscular re-education;Stair training;Patient/family education;Therapeutic activities;Cryotherapy;Therapeutic exercise;Electrical Stimulation;Manual techniques;Moist Heat;Balance training   PT Next Visit Plan balance and strength training   PT Home Exercise Plan instructed in hand putty exericses, dynamic standing balance .   Consulted and Agree with Plan of Care Patient;Family member/caregiver        Problem List Patient Active Problem List   Diagnosis Date Noted  . Chest pain 05/05/2012  . Hyperlipidemia 05/05/2012  . Essential hypertension 05/05/2012  . Smoking history 05/05/2012    Alanson Puls 09/12/2014, 4:10 PM  [place] MAIN [facility] SERVICES [address] [place], [place], [address] Phone: [phone]   Fax:  [phone]

## 2014-09-17 ENCOUNTER — Encounter: Payer: Self-pay | Admitting: Physical Therapy

## 2014-09-17 ENCOUNTER — Ambulatory Visit: Payer: BLUE CROSS/BLUE SHIELD | Admitting: Physical Therapy

## 2014-09-17 DIAGNOSIS — R278 Other lack of coordination: Secondary | ICD-10-CM

## 2014-09-17 DIAGNOSIS — R279 Unspecified lack of coordination: Principal | ICD-10-CM

## 2014-09-17 NOTE — Therapy (Signed)
[place] MAIN [facility] SERVICES [address] [place], [place], [address] Phone: [phone]   Fax:  [phone]  Physical Therapy Treatment  Patient Details  Name: Hunter Weber MRN: [id number] Date of Birth: 06-27-1965 Referring Provider:  Vladimir Crofts, MD  Encounter Date: 09/17/2014      PT End of Session - 09/17/14 1506    Visit Number 4   Number of Visits 10   Date for PT Re-Evaluation 10/30/14   PT Start Time 0300   PT Stop Time 0345   PT Time Calculation (min) 45 min      Past Medical History  Diagnosis Date  . Hyperlipidemia   . Hypertension   . Other convulsions   . Seizure disorder   . History of depression   . Low libido   . Positive ANA (antinuclear antibody)     Past Surgical History  Procedure Laterality Date  . Mandible fracture surgery      There were no vitals filed for this visit.  Visit Diagnosis:  Decreased coordination      Subjective Assessment - 09/17/14 1458    Subjective Patient is doing well with no changes.            Coordination exercises for LUE including shuffling cards, and dealing cards, pinching clothes pins on ruler, screwing on nuts and bolts. Patient was seen for high level balance activities including tandem standing on 1/2 foam catching a ball and tossing balloon, weight shifting on disk and 1/2 foam. bench press on left 75 lbs, 120 lbs RLE x 20 x 3. Continues to have balance deficits typical with diagnosis. Patient performs intermediate level exercises without pain behaviors and needs verbal cuing for postural alignment and head positioning. Muscle fatigue but no major pain complaints..                       PT Education - 09/17/14 1504    Education provided Yes   Education Details strengthening program for LE   Person(s) Educated Patient   Methods Explanation;Demonstration;Tactile cues   Comprehension Verbalized understanding;Returned demonstration;Verbal  cues required             PT Long Term Goals - 09/04/14 1409    PT LONG TERM GOAL #1   Title Patient will be independent in home exercise program to improve strength/mobility for better functional independence with ADLs 10/30/14   Status New   PT LONG TERM GOAL #2   Title  Patient (< 54 years old) will complete five times sit to stand test in < 10 seconds indicating an increased LE strength and improved balance 10/30/14   Status New   PT LONG TERM GOAL #3   Title Patient will tolerate 5 seconds of tandem stand stance without loss of balance to improve ability to get in and out of shower safely 10/30/14   Status New               Plan - 09/17/14 1507    Clinical Impression Statement Pateint has weakness and decreased coordinatin of L hand, weakness in LLE. Patient will benefit from skilled PT to improve mobility and function.    Pt will benefit from skilled therapeutic intervention in order to improve on the following deficits Abnormal gait;Decreased coordination;Difficulty walking;Decreased endurance;Decreased safety awareness;Impaired sensation;Decreased balance;Impaired UE functional use;Decreased mobility;Decreased strength   Rehab Potential Good   PT Frequency 2x / week   PT Duration 8 weeks  PT Treatment/Interventions Gait training;Neuromuscular re-education;Stair training;Patient/family education;Therapeutic activities;Cryotherapy;Therapeutic exercise;Electrical Stimulation;Manual techniques;Moist Heat;Balance training   PT Next Visit Plan balance and strength training   PT Home Exercise Plan instructed in hand putty exericses, dynamic standing balance , strengthening of LE's.   Consulted and Agree with Plan of Care Patient        Problem List Patient Active Problem List   Diagnosis Date Noted  . Chest pain 05/05/2012  . Hyperlipidemia 05/05/2012  . Essential hypertension 05/05/2012  . Smoking history 05/05/2012    Alanson Puls 09/17/2014, 4:41  PM  [place] [facility] MAIN [facility] SERVICES [address] [place], [place], [address] Phone: [phone]   Fax:  [phone]

## 2014-09-19 ENCOUNTER — Ambulatory Visit: Payer: 59 | Attending: Neurology | Admitting: Physical Therapy

## 2014-09-19 ENCOUNTER — Encounter: Payer: Self-pay | Admitting: Physical Therapy

## 2014-09-19 DIAGNOSIS — R278 Other lack of coordination: Secondary | ICD-10-CM | POA: Insufficient documentation

## 2014-09-19 DIAGNOSIS — R279 Unspecified lack of coordination: Secondary | ICD-10-CM

## 2014-09-19 NOTE — Therapy (Signed)
[place] MAIN [facility] SERVICES [address] [place], [place], [address] Phone: [phone]   Fax:  [phone]  Physical Therapy Treatment  Patient Details  Name: Hunter Weber MRN: [id number] Date of Birth: [birth year] Referring Provider:  Vladimir Crofts, MD  Encounter Date: 09/19/2014      PT End of Session - 09/19/14 1637    Visit Number 5   Number of Visits 10   Date for PT Re-Evaluation 10/30/14   PT Start Time 0400   PT Stop Time 0440   PT Time Calculation (min) 40 min   Equipment Utilized During Treatment Gait belt   Activity Tolerance Patient tolerated treatment well   Behavior During Therapy [facility] for tasks assessed/performed      Past Medical History  Diagnosis Date  . Hyperlipidemia   . Hypertension   . Other convulsions   . Seizure disorder   . History of depression   . Low libido   . Positive ANA (antinuclear antibody)     Past Surgical History  Procedure Laterality Date  . Mandible fracture surgery      There were no vitals filed for this visit.  Visit Diagnosis:  Decreased coordination      Subjective Assessment - 09/19/14 1635    Subjective Patient says that his left hand fingers are numb and his lips are numb and his tongue on the tip . Patient is doing his HEP.          Therapeutic exercise including:  Lunges x 5 BLE x 2 sets Squats with ball x 10 with 5 sec hold 1/2 foam balloon toss Peg board with grove x 2 Peg into board with large gripper Leg press 75 lbs LLE, RLE 150 lbs   Fatigue still evident with leg press and hand exercises and also balance and endurance.                        PT Education - 09/19/14 1636    Education provided Yes   Education Details HEP instruction   Person(s) Educated Patient   Methods Explanation;Demonstration;Tactile cues   Comprehension Verbalized understanding;Returned demonstration;Verbal cues required             PT Long Term Goals  - 09/04/14 1409    PT LONG TERM GOAL #1   Title Patient will be independent in home exercise program to improve strength/mobility for better functional independence with ADLs 10/30/14   Status New   PT LONG TERM GOAL #2   Title  Patient (49 years old) will complete five times sit to stand test in < 10 seconds indicating an increased LE strength and improved balance 10/30/14   Status New   PT LONG TERM GOAL #3   Title Patient will tolerate 5 seconds of tandem stand stance without loss of balance to improve ability to get in and out of shower safely 10/30/14   Status New               Plan - 09/19/14 1638    Clinical Impression Statement Patient is able to perform exercises, coordination exericses and dynamic standing balance training and he will continue to benefot from skilled PT to improve gait and coordination.    Pt will benefit from skilled therapeutic intervention in order to improve on the following deficits Abnormal gait;Decreased coordination;Difficulty walking;Decreased endurance;Decreased safety awareness;Impaired sensation;Decreased balance;Impaired UE functional use;Decreased mobility;Decreased strength   PT Frequency 2x / week  PT Duration 8 weeks   PT Treatment/Interventions Gait training;Neuromuscular re-education;Stair training;Patient/family education;Therapeutic activities;Cryotherapy;Therapeutic exercise;Electrical Stimulation;Manual techniques;Moist Heat;Balance training   PT Next Visit Plan balance and strength training   PT Home Exercise Plan instructed in hand putty exericses, dynamic standing balance , strengthening of LE's.   Consulted and Agree with Plan of Care Patient        Problem List Patient Active Problem List   Diagnosis Date Noted  . Chest pain 05/05/2012  . Hyperlipidemia 05/05/2012  . Essential hypertension 05/05/2012  . Smoking history 05/05/2012    Alanson Puls 09/19/2014, 4:41 PM  [place] [facility] MAIN [facility] SERVICES [address] [place], [place], [address] Phone: [phone]   Fax:  [phone]

## 2014-09-23 ENCOUNTER — Encounter: Payer: Self-pay | Admitting: Physical Therapy

## 2014-09-23 ENCOUNTER — Ambulatory Visit: Payer: 59 | Attending: Neurology | Admitting: Physical Therapy

## 2014-09-23 DIAGNOSIS — R279 Unspecified lack of coordination: Secondary | ICD-10-CM

## 2014-09-23 DIAGNOSIS — R2689 Other abnormalities of gait and mobility: Secondary | ICD-10-CM | POA: Insufficient documentation

## 2014-09-23 DIAGNOSIS — R278 Other lack of coordination: Secondary | ICD-10-CM

## 2014-09-23 DIAGNOSIS — R531 Weakness: Secondary | ICD-10-CM | POA: Insufficient documentation

## 2014-09-23 NOTE — Therapy (Signed)
[place] MAIN [facility] SERVICES [address] [place], [place], [address] Phone: [phone]   Fax:  [phone]  Physical Therapy Treatment  Patient Details  Name: Hunter Weber MRN: [id number] Date of Birth: 1965-07-30 Referring Provider:  No ref. provider found  Encounter Date: 09/23/2014      PT End of Session - 09/23/14 1511    Visit Number 6   Number of Visits 10   Date for PT Re-Evaluation 10/30/14   PT Start Time 0300   PT Stop Time 0345   PT Time Calculation (min) 45 min   Equipment Utilized During Treatment Gait belt   Activity Tolerance Patient tolerated treatment well   Behavior During Therapy WFL for tasks assessed/performed      Past Medical History  Diagnosis Date  . Hyperlipidemia   . Hypertension   . Other convulsions   . Seizure disorder   . History of depression   . Low libido   . Positive ANA (antinuclear antibody)     Past Surgical History  Procedure Laterality Date  . Mandible fracture surgery      There were no vitals filed for this visit.  Visit Diagnosis:  Decreased coordination      Subjective Assessment - 09/23/14 1509    Subjective Patient continues to have symptoms of weakness and tingling in his left hand and left leg.                 Therapeutic exercise including:  Lunges x 5 BLE x 2 sets Squats with ball x 10 with 5 sec hold 1/2 foam balloon toss Peg board with grove x 2 Peg into board with large gripper Leg press 75 lbs LLE, RLE 150 lbs  Fatigue still evident with leg press and hand exercises and also balance and endurance.                                                PT Education - 09/23/14 1510    Education provided Yes   Education Details HEP instructions   Person(s) Educated Patient   Methods Explanation;Demonstration;Tactile cues   Comprehension Verbalized understanding;Returned demonstration;Verbal cues required              PT Long Term Goals - 09/04/14 1409    PT LONG TERM GOAL #1   Title Patient will be independent in home exercise program to improve strength/mobility for better functional independence with ADLs 10/30/14   Status New   PT LONG TERM GOAL #2   Title  Patient (< 48 years old) will complete five times sit to stand test in < 10 seconds indicating an increased LE strength and improved balance 10/30/14   Status New   PT LONG TERM GOAL #3   Title Patient will tolerate 5 seconds of tandem stand stance without loss of balance to improve ability to get in and out of shower safely 10/30/14   Status New               Plan - 09/23/14 1512    Clinical Impression Statement Encouraged to keep upright posture with squats.        Problem List Patient Active Problem List   Diagnosis Date Noted  . Chest pain 05/05/2012  . Hyperlipidemia 05/05/2012  . Essential hypertension 05/05/2012  . Smoking history 05/05/2012  [name] 09/23/2014, 3:14 PM  [place] MAIN [facility] SERVICES [address] [place], [place], [address] Phone: [phone]   Fax:  [phone]

## 2014-09-26 ENCOUNTER — Ambulatory Visit: Payer: 59 | Admitting: Physical Therapy

## 2014-09-26 ENCOUNTER — Encounter: Payer: Self-pay | Admitting: Physical Therapy

## 2014-09-26 DIAGNOSIS — R279 Unspecified lack of coordination: Principal | ICD-10-CM

## 2014-09-26 DIAGNOSIS — R278 Other lack of coordination: Secondary | ICD-10-CM

## 2014-09-26 NOTE — Therapy (Signed)
[place] MAIN [facility] SERVICES [address] [place], [place], [address] Phone: [phone]   Fax:  [phone]  Physical Therapy Treatment  Patient Details  Name: Hunter Weber MRN: [id number] Date of Birth: 01-28-1966 Referring Provider:  Vladimir Crofts, MD  Encounter Date: 09/26/2014      PT End of Session - 09/26/14 1514    Visit Number 7   Number of Visits 10   Date for PT Re-Evaluation 10/30/14   PT Start Time 0300   PT Stop Time 0345   PT Time Calculation (min) 45 min   Equipment Utilized During Treatment Gait belt   Activity Tolerance Patient tolerated treatment well   Behavior During Therapy [facility] for tasks assessed/performed      Past Medical History  Diagnosis Date  . Hyperlipidemia   . Hypertension   . Other convulsions   . Seizure disorder   . History of depression   . Low libido   . Positive ANA (antinuclear antibody)     Past Surgical History  Procedure Laterality Date  . Mandible fracture surgery      There were no vitals filed for this visit.  Visit Diagnosis:  Decreased coordination      Subjective Assessment - 09/26/14 1513    Subjective Patient continues to have symptoms of weakness and tingling in his left hand and left leg.         Therapeutic exercise including:  Lunges x 5 BLE x 2 sets Squats with ball x 10 with 5 sec hold 1/2 foam balloon toss Peg board with grove x 2 Peg into board with large gripper Leg press 75 lbs LLE, RLE 150 lbs  Fatigue still evident with leg press and hand exercises and also balance and endurance.  VC needed to full ext knees on leg press for increase muscle control                          PT Education - 09/26/14 1513    Education provided Yes   Person(s) Educated Patient   Methods Explanation;Demonstration;Tactile cues   Comprehension Returned demonstration;Verbal cues required             PT Long Term Goals - 09/04/14 1409     PT LONG TERM GOAL #1   Title Patient will be independent in home exercise program to improve strength/mobility for better functional independence with ADLs 10/30/14   Status New   PT LONG TERM GOAL #2   Title  Patient (< 25 years old) will complete five times sit to stand test in < 10 seconds indicating an increased LE strength and improved balance 10/30/14   Status New   PT LONG TERM GOAL #3   Title Patient will tolerate 5 seconds of tandem stand stance without loss of balance to improve ability to get in and out of shower safely 10/30/14   Status New               Plan - 09/26/14 1515    Clinical Impression Statement VC needed to full ext knees on leg press for increase muscle control   Pt will benefit from skilled therapeutic intervention in order to improve on the following deficits Abnormal gait;Decreased coordination;Difficulty walking;Decreased endurance;Decreased safety awareness;Impaired sensation;Decreased balance;Impaired UE functional use;Decreased mobility;Decreased strength   Rehab Potential Good   PT Frequency 2x / week   PT Duration 8 weeks   PT Treatment/Interventions Gait training;Neuromuscular re-education;Stair training;Patient/family  education;Therapeutic activities;Cryotherapy;Therapeutic exercise;Electrical Stimulation;Manual techniques;Moist Heat;Balance training   PT Next Visit Plan balance and strength training   PT Home Exercise Plan instructed in hand putty exericses, dynamic standing balance , strengthening of LE's.   Consulted and Agree with Plan of Care Patient        Problem List Patient Active Problem List   Diagnosis Date Noted  . Chest pain 05/05/2012  . Hyperlipidemia 05/05/2012  . Essential hypertension 05/05/2012  . Smoking history 05/05/2012    Alanson Puls 09/26/2014, 4:04 PM  [facility] MAIN [facility] SERVICES [address] [place], [place], [address] Phone: [phone]   Fax:   [phone]

## 2014-09-30 ENCOUNTER — Ambulatory Visit: Payer: BLUE CROSS/BLUE SHIELD | Admitting: Physical Therapy

## 2014-10-03 ENCOUNTER — Ambulatory Visit: Payer: 59 | Admitting: Physical Therapy

## 2014-10-03 ENCOUNTER — Encounter: Payer: Self-pay | Admitting: Physical Therapy

## 2014-10-03 DIAGNOSIS — R531 Weakness: Secondary | ICD-10-CM | POA: Diagnosis not present

## 2014-10-03 DIAGNOSIS — R279 Unspecified lack of coordination: Principal | ICD-10-CM

## 2014-10-03 DIAGNOSIS — R278 Other lack of coordination: Secondary | ICD-10-CM

## 2014-10-03 NOTE — Patient Instructions (Addendum)
All exercises provided were adapted from hep2go.com. Patient was provided a written handout with pictures as described. Any additional cues were manually entered in to handout and copied in to this document.  Single Leg Stance On Pillow (10 times, hold for 5 seconds, 2 sets, 1x per day)   Place pillow on floor. Stand on involved leg/foot on pillow and lift uninvolved leg/foot to balance.  Place hands close to wall for additional balance if needed.   Goblet Squat  (10 times, 2 sets, 1x per day)   Hold weight in both hands as pictured. Pull elbows in close together and perform squat. Elbows should end up between knees at the bottom of the squat.

## 2014-10-04 NOTE — Therapy (Signed)
[place] MAIN [facility] SERVICES [address] [place], [place], [address] Phone: [phone]   Fax:  [phone]  Physical Therapy Treatment  Patient Details  Name: STEPHANIE MCGLONE MRN: [id number] Date of Birth: [birth year] Referring Provider:  No ref. provider found  Encounter Date: 10/03/2014      PT End of Session - 10/03/14 1653    Visit Number 8   Number of Visits 10   Date for PT Re-Evaluation 10/30/14   PT Start Time 1500   PT Stop Time 1545   PT Time Calculation (min) 45 min   Equipment Utilized During Treatment Gait belt   Activity Tolerance Patient tolerated treatment well;Patient limited by fatigue   Behavior During Therapy WFL for tasks assessed/performed      Past Medical History  Diagnosis Date  . Hyperlipidemia   . Hypertension   . Other convulsions   . Seizure disorder   . History of depression   . Low libido   . Positive ANA (antinuclear antibody)     Past Surgical History  Procedure Laterality Date  . Mandible fracture surgery      There were no vitals filed for this visit.  Visit Diagnosis:  Decreased coordination      Subjective Assessment - 10/03/14 1503    Subjective Patient states he has been consistent with his HEP and has been finding improvement with his balance and LLE strength, though he is still not at his PLOF.    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 No/denies       There-Ex    5x sit to stand x 2 repetitions Tandem stance for 2 reptitions, able to hold for > 15 seconds bilaterally  Lunges x 10 bilaterally cuing for longer stride for knee over ankle  4" box step ups x 10 bilaterally 6" box step ups x 10 bilaterally  Sit to stand training x 10 for 2 sets  Goblet squats x 10 for 2 sets, 2nd set with  7#. Cuing for sitting back to the chair.  Leg Press 75# on LLE x 8 repetitions for 2 sets  Marching with 1 second hold bilaterally on blue foam pad x 10 bilaterally for 2 sets                          PT Education - 10/03/14 1652    Education provided Yes   Education Details Progressed HEP and educated patioent on progress thus far with therapy (having met initial goals).    Person(s) Educated Patient   Methods Explanation;Demonstration;Handout   Comprehension Verbalized understanding;Returned demonstration             PT Long Term Goals - 10/03/14 1530    PT LONG TERM GOAL #1   Title Patient will be independent in home exercise program to improve strength/mobility for better functional independence with ADLs 10/30/14   Status Partially Met   PT LONG TERM GOAL #2   Title  Patient (< 80 years old) will complete five times sit to stand test in < 10 seconds indicating an increased LE strength and improved balance 10/30/14   Status Achieved   PT LONG TERM GOAL #3   Title Patient will tolerate 5 seconds of tandem  stand stance without loss of balance to improve ability to get in and out of shower safely 10/30/14   Status Achieved               Plan - 10/03/14 1653    Clinical Impression Statement Patient displays improved 5x sit to stand times and tandem stance of over 15 seconds bilaterally, demonstrating significant clinical improvement since baseline. Patient continues to display LLE quadricep weakness and fatigue with exercise. Patient would benefit from continued skilled PT services to address LLE fatigue and weakness with mobility and increase LUE strength.    Pt will benefit from skilled therapeutic intervention in order to improve on the following deficits Abnormal gait;Decreased coordination;Difficulty walking;Decreased endurance;Decreased safety awareness;Impaired sensation;Decreased balance;Impaired UE functional use;Decreased mobility;Decreased strength    Rehab Potential Good   PT Frequency 2x / week   PT Duration 8 weeks   PT Treatment/Interventions Gait training;Neuromuscular re-education;Stair training;Patient/family education;Therapeutic activities;Cryotherapy;Therapeutic exercise;Electrical Stimulation;Manual techniques;Moist Heat;Balance training   PT Next Visit Plan Progress balance, strength, and endurance training for LLE particularly knee extensors.    PT Home Exercise Plan See in patient instructions.    Consulted and Agree with Plan of Care Patient        Problem List Patient Active Problem List   Diagnosis Date Noted  . Chest pain 05/05/2012  . Hyperlipidemia 05/05/2012  . Essential hypertension 05/05/2012  . Smoking history 05/05/2012   Kerman Passey, PT, DPT   10/04/2014, 8:18 AM  [place] MAIN [facility] SERVICES [address] [place], [place], [address] Phone: [phone]   Fax:  [phone]

## 2014-10-07 ENCOUNTER — Ambulatory Visit: Payer: 59 | Admitting: Physical Therapy

## 2014-10-07 ENCOUNTER — Encounter: Payer: Self-pay | Admitting: Physical Therapy

## 2014-10-07 DIAGNOSIS — R279 Unspecified lack of coordination: Principal | ICD-10-CM

## 2014-10-07 DIAGNOSIS — R278 Other lack of coordination: Secondary | ICD-10-CM | POA: Diagnosis not present

## 2014-10-07 NOTE — Therapy (Signed)
[place] MAIN [facility] SERVICES [address] [place], [place], [address] Phone: [phone]   Fax:  [phone]  Physical Therapy Treatment  Patient Details  Name: Hunter Weber MRN: [id number] Date of Birth: 1966-02-19 Referring Provider:  Vladimir Crofts, MD  Encounter Date: 10/07/2014    Past Medical History  Diagnosis Date  . Hyperlipidemia   . Hypertension   . Other convulsions   . Seizure disorder   . History of depression   . Low libido   . Positive ANA (antinuclear antibody)     Past Surgical History  Procedure Laterality Date  . Mandible fracture surgery      There were no vitals filed for this visit.  Visit Diagnosis:  No diagnosis found.   Outcome measures taken all improved Peg Right 23.35 sec left 33.59 6 MW  1550 feet 5 x sit to stand  10.31 grip strength right 41kg left 28 kg     Balance training including 2 disk with cone reaching left and right across midline Single leg standing on 1/2 foam with 4 way on opposite LE x 10 BLE Standing on foam with single leg with TB on opposite leg.  Patient is progressing with all outcome measures and will continue to work on dynamic standing balance, coordination and strengthening.                                                                                            PT Long Term Goals - 10/03/14 1530    PT LONG TERM GOAL #1   Title Patient will be independent in home exercise program to improve strength/mobility for better functional independence with ADLs 10/30/14   Status Partially Met   PT LONG TERM GOAL #2   Title  Patient (< 42 years old) will complete five times sit to stand test in < 10 seconds indicating an increased LE strength and improved balance 10/30/14   Status Achieved   PT LONG TERM GOAL #3   Title Patient will tolerate 5 seconds of tandem stand stance without loss of balance to improve ability to get in and out  of shower safely 10/30/14   Status Achieved               Problem List Patient Active Problem List   Diagnosis Date Noted  . Chest pain 05/05/2012  . Hyperlipidemia 05/05/2012  . Essential hypertension 05/05/2012  . Smoking history 05/05/2012    Alanson Puls 10/07/2014, 4:05 PM  [place] MAIN [facility] SERVICES [address] [place], [place], [address] Phone: [phone]   Fax:  [phone]

## 2014-10-14 ENCOUNTER — Ambulatory Visit: Payer: 59 | Admitting: Physical Therapy

## 2014-10-14 ENCOUNTER — Encounter: Payer: Self-pay | Admitting: Physical Therapy

## 2014-10-14 DIAGNOSIS — R278 Other lack of coordination: Secondary | ICD-10-CM | POA: Diagnosis not present

## 2014-10-14 DIAGNOSIS — R279 Unspecified lack of coordination: Principal | ICD-10-CM

## 2014-10-14 NOTE — Therapy (Signed)
[place] [facility] MAIN [facility] SERVICES [address] [place], [place], [address] Phone: [phone]   Fax:  [phone]  Physical Therapy Treatment  Patient Details  Name: Hunter Weber MRN: [id number] Date of Birth: 08/15/1965 Referring Provider:  Lonell Face, MD  Encounter Date: 10/14/2014      PT End of Session - 10/14/14 1638    Visit Number 10   Number of Visits 26   Date for PT Re-Evaluation 12/09/14   PT Start Time 0345   PT Stop Time 0430   PT Time Calculation (min) 45 min   Equipment Utilized During Treatment Gait belt   Activity Tolerance Patient tolerated treatment well;Patient limited by fatigue   Behavior During Therapy [facility] for tasks assessed/performed      Past Medical History  Diagnosis Date  . Hyperlipidemia   . Hypertension   . Other convulsions   . Seizure disorder   . History of depression   . Low libido   . Positive ANA (antinuclear antibody)     Past Surgical History  Procedure Laterality Date  . Mandible fracture surgery      There were no vitals filed for this visit.  Visit Diagnosis:  Decreased coordination      Subjective Assessment - 10/14/14 1641    Subjective Patient is performing his HEP and has no new issues.    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    Limitations Walking;Writing   Patient Stated Goals To improve mobility safety and increase his activity levels to as close as possible to PLOF                                  PT Education - 10/14/14 1642    Education provided Yes   Education Details HEP progression   Person(s) Educated Patient   Methods Explanation;Demonstration   Comprehension Verbalized understanding;Returned demonstration             PT Long Term Goals - 10/14/14 1653    PT LONG TERM GOAL #1   Status Achieved   PT LONG TERM GOAL #2   Status Partially Met   PT LONG TERM GOAL #3   Status Partially Met   PT LONG  TERM GOAL #4   Title Patient will improve coordination in left hand and improve his ability to write and ride his motorcycle.12/11/14   Status New               Plan - 10/14/14 1643    Clinical Impression Statement Patient conintues to have LE weakness and decreased fine motor control for L hand. He will continue to benefit form skilled PT to improve strength and coordination.    Pt will benefit from skilled therapeutic intervention in order to improve on the following deficits Abnormal gait;Decreased coordination;Difficulty walking;Decreased endurance;Decreased safety awareness;Impaired sensation;Decreased balance;Impaired UE functional use;Decreased mobility;Decreased strength   Rehab Potential Good   PT Frequency 2x / week   PT Duration 8 weeks   PT Treatment/Interventions Gait training;Neuromuscular re-education;Stair training;Patient/family education;Therapeutic activities;Cryotherapy;Therapeutic exercise;Electrical Stimulation;Manual techniques;Moist Heat;Balance training   PT Next Visit Plan Progress balance, strength, and endurance training for LLE particularly knee extensors.         Problem List Patient Active Problem List   Diagnosis Date Noted  . Chest pain 05/05/2012  . Hyperlipidemia 05/05/2012  . Essential hypertension 05/05/2012  . Smoking history 05/05/2012  [name] 10/14/2014, 4:55 PM  [place] MAIN [facility] SERVICES [address] [place], [place], [address] Phone: [phone]   Fax:  [phone]     Therapeutic exercise including:  Lunges x 5 BLE x 2 sets Squats with ball x 10 with 5 sec hold 1/2 foam balloon toss Peg board with grove x 2 Peg into board with large gripper Leg press 75 lbs LLE, RLE 150 lbs  Fatigue still evident with leg press and hand exercises and also balance and endurance.  VC needed to full ext knees on leg press for increase muscle control

## 2014-10-17 ENCOUNTER — Ambulatory Visit: Payer: 59 | Admitting: Physical Therapy

## 2014-10-23 ENCOUNTER — Ambulatory Visit: Payer: 59 | Attending: Neurology | Admitting: Physical Therapy

## 2014-10-23 DIAGNOSIS — R279 Unspecified lack of coordination: Secondary | ICD-10-CM | POA: Diagnosis not present

## 2014-10-23 DIAGNOSIS — R278 Other lack of coordination: Secondary | ICD-10-CM

## 2014-10-23 NOTE — Therapy (Signed)
[place] MAIN [facility] SERVICES [address] [place], [place], [address] Phone: [phone]   Fax:  [phone]  Physical Therapy Treatment  Patient Details  Name: Hunter Weber MRN: [id number] Date of Birth: 08/24/1965 Referring Provider:  Vladimir Crofts, MD  Encounter Date: 10/23/2014      PT End of Session - 10/23/14 1450    Visit Number 11   Number of Visits 26   Date for PT Re-Evaluation 12/09/14   PT Start Time 0230   PT Stop Time 0315   PT Time Calculation (min) 45 min   Equipment Utilized During Treatment Gait belt   Activity Tolerance Patient tolerated treatment well;Patient limited by fatigue   Behavior During Therapy [facility] for tasks assessed/performed      Past Medical History  Diagnosis Date  . Hyperlipidemia   . Hypertension   . Other convulsions   . Seizure disorder   . History of depression   . Low libido   . Positive ANA (antinuclear antibody)     Past Surgical History  Procedure Laterality Date  . Mandible fracture surgery      There were no vitals filed for this visit.  Visit Diagnosis:  Decreased coordination      Subjective Assessment - 10/23/14 1447    Subjective Patient is performing his HEP and has no new issues.    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 No/denies   Multiple Pain Sites No       UE coordination activities including coin sorting, card sorting , peg size into board . Patient needs visual cues to coordinate with coins and pegs.    side stepping on blue balance beam, fwd stepping on blue balance beam Step ups fwd/bwd Step laterally left and right Tapping feet on stool Feet gotether/feet tandem Balloon toss on 1/2 foam , ball catch on 1/2 foam and foam, trunk rotation left and right and  vertical head movements Needs tactile and verbal cues for balance exercise and good posture.                         PT Education - 10/23/14 1449    Education provided Yes   Education Details HEP progression   Person(s) Educated Patient   Methods Explanation   Comprehension Verbalized understanding             PT Long Term Goals - 10/14/14 1653    PT LONG TERM GOAL #1   Status Achieved   PT LONG TERM GOAL #2   Status Partially Met   PT LONG TERM GOAL #3   Status Partially Met   PT LONG TERM GOAL #4   Title Patient will improve coordination in left hand and improve his ability to write and ride his motorcycle.12/11/14   Status New               Plan - 10/23/14 1451    Clinical Impression Statement Patient continues to demonstrate some in coordination of movement with select exercises Patient has loss of balance and needs UE support intermittently thorough out exercise   Pt will benefit from skilled therapeutic intervention in order to improve on the following deficits Abnormal gait;Decreased coordination;Difficulty walking;Decreased endurance;Decreased safety awareness;Impaired sensation;Decreased balance;Impaired UE  functional use;Decreased mobility;Decreased strength   Rehab Potential Good   PT Frequency 2x / week   PT Duration 8 weeks   PT Treatment/Interventions Gait training;Neuromuscular re-education;Stair training;Patient/family education;Therapeutic activities;Cryotherapy;Therapeutic exercise;Electrical Stimulation;Manual techniques;Moist Heat;Balance training   PT Next Visit Plan Progress balance, strength, and endurance training for LLE particularly knee extensors.         Problem List Patient Active Problem List   Diagnosis Date Noted  . Chest pain 05/05/2012  . Hyperlipidemia 05/05/2012  . Essential hypertension 05/05/2012  . Smoking history 05/05/2012    Alanson Puls 10/23/2014, 2:54 PM  [place] MAIN [facility] SERVICES [address] [place], [place], [address] Phone: [phone]   Fax:  [phone]

## 2014-10-28 ENCOUNTER — Encounter: Payer: Self-pay | Admitting: Physical Therapy

## 2014-10-28 ENCOUNTER — Ambulatory Visit: Payer: 59 | Admitting: Physical Therapy

## 2014-10-28 DIAGNOSIS — R278 Other lack of coordination: Secondary | ICD-10-CM

## 2014-10-28 DIAGNOSIS — R279 Unspecified lack of coordination: Principal | ICD-10-CM

## 2014-10-28 NOTE — Therapy (Signed)
[place] MAIN [facility] SERVICES [address] [place], [place], [address] Phone: [phone]   Fax:  [phone]  Physical Therapy Treatment  Patient Details  Name: Hunter Weber MRN: [id number] Date of Birth: February 20, 1966 Referring Provider:  Vladimir Crofts, MD  Encounter Date: 10/28/2014      PT End of Session - 10/28/14 1619    Visit Number 12   Number of Visits 26   Date for PT Re-Evaluation 12/09/14   Equipment Utilized During Treatment Gait belt   Activity Tolerance Patient tolerated treatment well;Patient limited by fatigue   Behavior During Therapy [facility] for tasks assessed/performed      Past Medical History  Diagnosis Date  . Hyperlipidemia   . Hypertension   . Other convulsions   . Seizure disorder   . History of depression   . Low libido   . Positive ANA (antinuclear antibody)     Past Surgical History  Procedure Laterality Date  . Mandible fracture surgery      There were no vitals filed for this visit.  Visit Diagnosis:  Decreased coordination      Subjective Assessment - 10/28/14 1618    Subjective Patient is performing his HEP and has no new issues.    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  Patient is progressing with all outcome measures and will continue to work on dynamic standing balance, coordination and strengthening                         PT Education - 10/28/14 1618    Education provided Yes   Education Details  Progressed standing balance exercises   Person(s) Educated Patient   Methods Explanation   Comprehension Verbalized understanding             PT Long Term Goals - 10/14/14 1653    PT LONG TERM GOAL #1   Status Achieved   PT LONG TERM GOAL #2   Status Partially Met   PT LONG TERM GOAL #3   Status Partially Met   PT LONG TERM GOAL #4   Title Patient will improve coordination in left hand and improve his ability to write and ride his motorcycle.12/11/14   Status New               Plan - 10/28/14 1619    Clinical Impression Statement Patient has loss of balance and needs UE support intermittently thorough out exercise. Decreased coordination demonstrated requiring consistent verbal cueing to correct    Pt will benefit from skilled therapeutic intervention in order to improve on the following deficits Abnormal gait;Decreased coordination;Difficulty walking;Decreased endurance;Decreased safety awareness;Impaired sensation;Decreased balance;Impaired UE functional use;Decreased mobility;Decreased strength   Rehab Potential Good   PT Frequency 2x / week   PT Duration 8 weeks   PT Treatment/Interventions Gait training;Neuromuscular re-education;Stair training;Patient/family education;Therapeutic activities;Cryotherapy;Therapeutic exercise;Electrical Stimulation;Manual techniques;Moist Heat;Balance training   PT Next  Visit Plan Progress balance, strength, and endurance training for LLE particularly knee extensors.         Problem List Patient Active Problem List   Diagnosis Date Noted  . Chest pain 05/05/2012  . Hyperlipidemia 05/05/2012  . Essential hypertension 05/05/2012  . Smoking history 05/05/2012    Alanson Puls 10/28/2014, 4:21 PM  [place] MAIN [facility] SERVICES [address] [place], [place], [address] Phone: [phone]   Fax:  [phone]

## 2014-10-30 ENCOUNTER — Ambulatory Visit: Payer: 59 | Admitting: Physical Therapy

## 2014-11-04 ENCOUNTER — Ambulatory Visit: Payer: 59 | Admitting: Physical Therapy

## 2014-11-04 ENCOUNTER — Encounter: Payer: Self-pay | Admitting: Physical Therapy

## 2014-11-04 DIAGNOSIS — R279 Unspecified lack of coordination: Principal | ICD-10-CM

## 2014-11-04 DIAGNOSIS — R278 Other lack of coordination: Secondary | ICD-10-CM

## 2014-11-04 NOTE — Therapy (Addendum)
[place] MAIN [facility] SERVICES [address] [place], [place], [address] Phone: [phone]   Fax:  [phone]  Physical Therapy Treatment/ Discharge summary  Patient Details  Name: Hunter Weber MRN: [id number] Date of Birth: May 28, 1965 Referring Provider:  Vladimir Crofts, MD  Encounter Date: 11/04/2014      PT End of Session - 11/04/14 1520    Visit Number 12   Number of Visits 26   Date for PT Re-Evaluation 12/09/14   PT Start Time 0315   PT Stop Time 0400   PT Time Calculation (min) 45 min   Equipment Utilized During Treatment Gait belt   Activity Tolerance Patient tolerated treatment well;Patient limited by fatigue   Behavior During Therapy [facility] for tasks assessed/performed      Past Medical History  Diagnosis Date  . Hyperlipidemia   . Hypertension   . Other convulsions   . Seizure disorder   . History of depression   . Low libido   . Positive ANA (antinuclear antibody)     Past Surgical History  Procedure Laterality Date  . Mandible fracture surgery      There were no vitals filed for this visit.  Visit Diagnosis:  Decreased coordination      Subjective Assessment - 11/04/14 1519    Subjective Patient is performing his HEP and has no new issues.    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 No/denies        Outcome measures performed:  5 x sit to stand 11.54 TUG=7.07 10 MW=10.15m/sec Peg L =42.42 sec  Peg R23.06 sec Grip left 34 lbs right 42lbs 6 MW test=1675  Coordination training:  for BUE hands including card shuffling, coin pick up, peg shape  Therapeutic exercise including leg press x 100 lbs and 20 x 3 sets, heel raises with 100 lbs x 4 sets of 15                       PT Education - 11/04/14  1519    Education provided Yes   Education Details Progressed standing balance   Person(s) Educated Patient   Methods Explanation   Comprehension Verbalized understanding             PT Long Term Goals - 11/05/14 1036    PT LONG TERM GOAL #1   Status Achieved   PT LONG TERM GOAL #2   Status Partially Met   PT LONG TERM GOAL #3   Status Achieved   PT LONG TERM GOAL #4   Status Partially Met               Plan - 11/04/14 1520    Clinical Impression Statement . Fatigue with sit to stand but demonstrating more control, Increase weight for standing exercises.   Pt will benefit from skilled therapeutic intervention in order to improve on the following deficits Abnormal gait;Decreased coordination;Difficulty walking;Decreased endurance;Decreased safety awareness;Impaired sensation;Decreased balance;Impaired UE functional use;Decreased mobility;Decreased strength   Rehab Potential Good   PT Frequency 2x / week   PT Duration 8 weeks   PT Treatment/Interventions Gait training;Neuromuscular re-education;Stair training;Patient/family education;Therapeutic activities;Cryotherapy;Therapeutic exercise;Electrical Stimulation;Manual techniques;Moist Heat;Balance training   PT Next Visit Plan Progress balance, strength, and endurance training for LLE particularly  knee extensors.         Problem List Patient Active Problem List   Diagnosis Date Noted  . Chest pain 05/05/2012  . Hyperlipidemia 05/05/2012  . Essential hypertension 05/05/2012  . Smoking history 05/05/2012    Alanson Puls 11/05/2014, 10:37 AM  [place] MAIN [facility] SERVICES [address] [place], [place], [address] Phone: [phone]   Fax:  [phone]

## 2014-11-06 ENCOUNTER — Ambulatory Visit: Payer: 59 | Admitting: Physical Therapy

## 2015-07-18 ENCOUNTER — Encounter: Payer: Self-pay | Admitting: Emergency Medicine

## 2015-07-18 ENCOUNTER — Emergency Department: Payer: BLUE CROSS/BLUE SHIELD

## 2015-07-18 ENCOUNTER — Emergency Department
Admission: EM | Admit: 2015-07-18 | Discharge: 2015-07-18 | Disposition: A | Discharge status: Home / Self Care | Payer: BLUE CROSS/BLUE SHIELD | Attending: Emergency Medicine | Admitting: Emergency Medicine

## 2015-07-18 DIAGNOSIS — I10 Essential (primary) hypertension: Secondary | ICD-10-CM | POA: Diagnosis not present

## 2015-07-18 DIAGNOSIS — G453 Amaurosis fugax: Secondary | ICD-10-CM | POA: Insufficient documentation

## 2015-07-18 DIAGNOSIS — F329 Major depressive disorder, single episode, unspecified: Secondary | ICD-10-CM | POA: Diagnosis not present

## 2015-07-18 DIAGNOSIS — Z87891 Personal history of nicotine dependence: Secondary | ICD-10-CM | POA: Insufficient documentation

## 2015-07-18 DIAGNOSIS — R569 Unspecified convulsions: Secondary | ICD-10-CM

## 2015-07-18 DIAGNOSIS — H547 Unspecified visual loss: Secondary | ICD-10-CM | POA: Diagnosis present

## 2015-07-18 DIAGNOSIS — E785 Hyperlipidemia, unspecified: Secondary | ICD-10-CM | POA: Insufficient documentation

## 2015-07-18 LAB — CBC WITH DIFFERENTIAL/PLATELET
BASOS ABS: 0.1 10*3/uL (ref 0–0.1)
Eosinophils Absolute: 0.1 10*3/uL (ref 0–0.7)
HCT: 46 % (ref 40.0–52.0)
Hemoglobin: 16.4 g/dL (ref 13.0–18.0)
Lymphocytes Relative: 24 %
Lymphs Abs: 2.5 10*3/uL (ref 1.0–3.6)
MCH: 32.4 pg (ref 26.0–34.0)
MCHC: 35.7 g/dL (ref 32.0–36.0)
MCV: 90.7 fL (ref 80.0–100.0)
MONO ABS: 0.9 10*3/uL (ref 0.2–1.0)
Neutro Abs: 6.6 10*3/uL — ABNORMAL HIGH (ref 1.4–6.5)
Neutrophils Relative %: 65 %
PLATELETS: 218 10*3/uL (ref 150–440)
RBC: 5.08 MIL/uL (ref 4.40–5.90)
RDW: 13.3 % (ref 11.5–14.5)
WBC: 10.3 10*3/uL (ref 3.8–10.6)

## 2015-07-18 LAB — COMPREHENSIVE METABOLIC PANEL
ALBUMIN: 4.7 g/dL (ref 3.5–5.0)
ALT: 27 U/L (ref 17–63)
AST: 27 U/L (ref 15–41)
Alkaline Phosphatase: 67 U/L (ref 38–126)
Anion gap: 3 — ABNORMAL LOW (ref 5–15)
BILIRUBIN TOTAL: 0.9 mg/dL (ref 0.3–1.2)
BUN: 16 mg/dL (ref 6–20)
CO2: 27 mmol/L (ref 22–32)
Calcium: 9.8 mg/dL (ref 8.9–10.3)
Chloride: 104 mmol/L (ref 101–111)
Creatinine, Ser: 1.52 mg/dL — ABNORMAL HIGH (ref 0.61–1.24)
GFR calc Af Amer: 60 mL/min — ABNORMAL LOW (ref >60)
GFR, EST NON AFRICAN AMERICAN: 51 mL/min — AB (ref >60)
Glucose, Bld: 87 mg/dL (ref 65–99)
POTASSIUM: 3.8 mmol/L (ref 3.5–5.1)
Sodium: 134 mmol/L — ABNORMAL LOW (ref 135–145)
TOTAL PROTEIN: 7.9 g/dL (ref 6.5–8.1)

## 2015-07-18 LAB — URINALYSIS COMPLETE WITH MICROSCOPIC (ARMC ONLY)
BILIRUBIN URINE: NEGATIVE
Bacteria, UA: NONE SEEN
GLUCOSE, UA: NEGATIVE mg/dL
HGB URINE DIPSTICK: NEGATIVE
KETONES UR: NEGATIVE mg/dL
LEUKOCYTES UA: NEGATIVE
NITRITE: NEGATIVE
PH: 7 (ref 5.0–8.0)
Protein, ur: NEGATIVE mg/dL
SQUAMOUS EPITHELIAL / LPF: NONE SEEN
Specific Gravity, Urine: 1.018 (ref 1.005–1.030)

## 2015-07-18 LAB — URINE DRUG SCREEN, QUALITATIVE (ARMC ONLY)
AMPHETAMINES, UR SCREEN: NOT DETECTED
BARBITURATES, UR SCREEN: NOT DETECTED
BENZODIAZEPINE, UR SCRN: NOT DETECTED
Cannabinoid 50 Ng, Ur ~~LOC~~: NOT DETECTED
Cocaine Metabolite,Ur ~~LOC~~: NOT DETECTED
MDMA (Ecstasy)Ur Screen: POSITIVE — AB
METHADONE SCREEN, URINE: NOT DETECTED
Opiate, Ur Screen: NOT DETECTED
Phencyclidine (PCP) Ur S: NOT DETECTED
TRICYCLIC, UR SCREEN: NOT DETECTED

## 2015-07-18 LAB — ETHANOL

## 2015-07-18 LAB — PROTIME-INR
INR: 1.02
PROTHROMBIN TIME: 13.6 s (ref 11.4–15.0)

## 2015-07-18 LAB — APTT: aPTT: 30 seconds (ref 24–36)

## 2015-07-18 LAB — TROPONIN I

## 2015-07-18 LAB — MAGNESIUM: MAGNESIUM: 2 mg/dL (ref 1.7–2.4)

## 2015-07-18 LAB — LIPASE, BLOOD: Lipase: 24 U/L (ref 11–51)

## 2015-07-18 MED ORDER — LORAZEPAM 2 MG/ML IJ SOLN
INTRAMUSCULAR | Status: AC
  Filled 2015-07-18: qty 1

## 2015-07-18 MED ORDER — LEVETIRACETAM 500 MG/5ML IV SOLN
1000.0000 mg | INTRAVENOUS | Status: AC
  Administered 2015-07-18: 1000 mg via INTRAVENOUS
  Filled 2015-07-18: qty 10

## 2015-07-18 MED ORDER — SODIUM CHLORIDE 0.9 % IV SOLN
Freq: Once | INTRAVENOUS | Status: AC
  Administered 2015-07-18: 19:00:00 via INTRAVENOUS

## 2015-07-18 NOTE — ED Notes (Signed)
E signature not working, [employer] instructions given and pt/wife verbalized undersrtanding

## 2015-07-18 NOTE — ED Notes (Signed)
Pt was asked if anything else was needed from staff/ pt. Replied that they were fine and comfortable at this time. Urine was collected and sent to lab (in addition)

## 2015-07-18 NOTE — ED Notes (Signed)
Water given per request, EDP accepted.

## 2015-07-18 NOTE — ED Provider Notes (Signed)
[facility] Emergency Department Provider Note  ____________________________________________  Time seen: Approximately 6:24 PM  I have reviewed the triage vital signs and the nursing notes.   HISTORY  Chief Complaint Loss of Vision    HPI Hunter Weber is a 50 y.o. 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  Additionally he feels "swimmy headed" and weak.  He denies headache.  The initial symptoms started acutely 2 days ago, then resolved, then recurred acutely.  He described them as moderate.  I was told by the triage nurse about this patient and initially put in for basic labs and a CT angiogram of his head with and without contrast, and as the IV was being placed, the patient had a seizure in triage.  He was brought directly back to an ED room and the seizure had resolved by the time he came back so he did not receive any Ativan.  He saw him as soon as he came to his room, and he was pale and diaphoretic and said he feels "like shit" but without any specific complaints.  He is alert and oriented without any evidence of post-ictal symptoms although the seizure in the lobby, witnessed by ED staff, was reportedly focal in that it involved his arms and head (shaking/tremor) but was not generalized.  Post-seizure he still denies headache, feels bad all over, appears ill (pale and diaphoretic).   Past Medical History  Diagnosis Date  . Hyperlipidemia   . Hypertension   . Other convulsions   . Seizure disorder ([place])   . History of depression   . Low libido   .  Positive ANA (antinuclear antibody)     Patient Active Problem List   Diagnosis Date Noted  . Chest pain 05/05/2012  . Hyperlipidemia 05/05/2012  . Essential hypertension 05/05/2012  . Smoking history 05/05/2012    Past Surgical History  Procedure Laterality Date  . Mandible fracture surgery      Current Outpatient Rx  Name  Route  Sig  Dispense  Refill  . fenofibrate 160 MG tablet   Oral   Take 1 tablet (160 mg total) by mouth daily.   90 tablet   3   . FLUoxetine (PROZAC) 20 MG tablet   Oral   Take 20 mg by mouth daily.         Marland Kitchen levETIRAcetam (KEPPRA) 500 MG tablet   Oral   Take 500 mg by mouth 2 (two) times daily.         Marland Kitchen losartan (COZAAR) 100 MG tablet   Oral   Take 100 mg by mouth daily.         Marland Kitchen lovastatin (MEVACOR) 20 MG tablet   Oral   Take 1 tablet (20 mg total) by mouth at bedtime.   90 tablet   3   . nitroGLYCERIN (NITROSTAT) 0.4 MG SL tablet   Sublingual   Place 1 tablet (0.4 mg total) under the tongue every 5 (five) minutes as needed for chest pain.   25 tablet   6  Allergies Shellfish allergy  Family History  Problem Relation Age of Onset  . Hypertension Mother   . Heart disease Father   . Heart attack Father   . Heart attack Brother     Social History Social History  Substance Use Topics  . Smoking status: Former Smoker -- 2.00 packs/day for 20 years    Types: Cigarettes  . Smokeless tobacco: None  . Alcohol Use: Yes     Comment: last drink thursday    Review of Systems Constitutional: No fever/chills Eyes: Vision loss in right eye two days ago, resolved, now blurry and decreased vision in R eye ENT: No sore throat. Cardiovascular: Denies chest pain. Respiratory: Denies shortness of breath. Gastrointestinal: No abdominal pain.  No nausea, no vomiting.  No diarrhea.  No constipation. Genitourinary: Negative for dysuria. Musculoskeletal: Negative for back pain. Skin: Negative for rash. Neurological: Focal vs  generalized seizure in triage  10-point ROS otherwise negative.  ____________________________________________   PHYSICAL EXAM:  ED Triage Vitals  Enc Vitals Group     BP 07/18/15 1814 132/82 mmHg     Pulse Rate 07/18/15 1814 95     Resp --      Temp 07/18/15 1814 98.3 F (36.8 C)     Temp Source 07/18/15 1814 Oral     SpO2 07/18/15 1814 93 %     Weight --      Height --      Head Cir --      Peak Flow --      Pain Score --      Pain Loc --      Pain Edu? --      Excl. in [place]? --      Constitutional: Alert and oriented. Ill-appearing, pale, diaphoretic, but AOx3 Eyes: Mild scleral icterus. PERRL. EOMI.  Subjective decrease in visual acuity in R eye (cannot formally test at the moment) Head: Atraumatic. Nose: No congestion/rhinnorhea. Mouth/Throat: Mucous membranes are moist.  Oropharynx non-erythematous. Neck: No stridor.  No meningeal signs.   Cardiovascular: Normal rate, regular rhythm. Good peripheral circulation. Grossly normal heart sounds.   Respiratory: Normal respiratory effort.  No retractions. Lungs CTAB. Gastrointestinal: Soft and nontender. No distention.  Musculoskeletal: No lower extremity tenderness nor edema. No gross deformities of extremities. Neurologic:  Normal speech and language. No gross focal neurologic deficits are appreciated.  Skin:  Skin is warm, dry and intact. No rash noted. Psychiatric: Mood and affect are normal. Speech and behavior are normal.  ____________________________________________   LABS (all labs ordered are listed, but only abnormal results are displayed)  Labs Reviewed  CBC WITH DIFFERENTIAL/PLATELET - Abnormal; Notable for the following:    Neutro Abs 6.6 (*)    All other components within normal limits  COMPREHENSIVE METABOLIC PANEL - Abnormal; Notable for the following:    Sodium 134 (*)    Creatinine, Ser 1.52 (*)    GFR calc non Af Amer 51 (*)    GFR calc Af Amer 60 (*)    Anion gap 3 (*)    All other components  within normal limits  URINE DRUG SCREEN, QUALITATIVE ([facility] ONLY) - Abnormal; Notable for the following:    MDMA ([place])Ur Screen POSITIVE (*)    All other components within normal limits  URINALYSIS COMPLETEWITH MICROSCOPIC ([facility] ONLY) - Abnormal; Notable for the following:    Color, Urine YELLOW (*)    APPearance CLEAR (*)    All other components within normal limits  LIPASE, BLOOD  PROTIME-INR  APTT  ETHANOL  TROPONIN I  MAGNESIUM   ____________________________________________  EKG  ED ECG REPORT I, Luiz Trumpower, the attending physician, personally viewed and interpreted this ECG.  Date: 07/18/2015 EKG Time: 18:24 Rate: 82 Rhythm: normal sinus rhythm QRS Axis: normal Intervals: normal ST/T Wave abnormalities: normal Conduction Disturbances: none Narrative Interpretation: unremarkable  ____________________________________________  RADIOLOGY   Ct Head Wo Contrast  07/18/2015  CLINICAL DATA:  Dizziness.  Right eye vision loss. EXAM: CT HEAD WITHOUT CONTRAST TECHNIQUE: Contiguous axial images were obtained from the base of the skull through the vertex without intravenous contrast. COMPARISON:  CT scan of August 14, 2014. FINDINGS: Bony calvarium appears intact. Old lacunar infarction is noted in right thalamus. No mass effect or midline shift is noted. Ventricular size is within normal limits. There is no evidence of mass lesion, hemorrhage or acute infarction. IMPRESSION: Old right thalamic lacunar infarction. No acute intracranial abnormality seen. Electronically Signed   By: Marijo Conception, M.D.   On: 07/18/2015 19:11   Mr Angiogram Head Wo Contrast  07/18/2015  CLINICAL DATA:  RIGHT eye vision loss and seizure. History of thalamus infarction, seizure disorder, hypertension, hyperlipidemia, positive ANA. EXAM: MRI HEAD WITHOUT CONTRAST MRA HEAD WITHOUT CONTRAST TECHNIQUE: Multiplanar, multiecho pulse sequences of the brain and surrounding structures were obtained without  intravenous contrast. Angiographic images of the head were obtained using MRA technique without contrast. COMPARISON:  CT head July 18, 2015 at 1859 hours and MRI of the head August 15, 2014 FINDINGS: MRI HEAD FINDINGS The ventricles and sulci are normal for patient's age. No abnormal parenchymal signal, mass lesions, mass effect. No reduced diffusion to suggest acute ischemia. Old RIGHT thalamus lacunar infarct. A few scattered subcentimeter supratentorial white matter FLAIR T2 hyperintensities are similar. No susceptibility artifact to suggest hemorrhage. Symmetric size, morphology and signal of the hippocampi. No abnormal extra-axial fluid collections. No extra-axial masses though, contrast enhanced sequences would be more sensitive. Normal major intracranial vascular flow voids seen at the skull base. Ocular globes and orbital contents are unremarkable though not tailored for evaluation. No abnormal sellar expansion. No suspicious calvarial bone marrow signal. Craniocervical junction maintained. Trace mastoid effusions. Paranasal sinuses are well-aerated. Patient is edentulous. MRA HEAD FINDINGS Anterior circulation: Normal flow related enhancement of the included cervical, petrous, cavernous and supraclinoid internal carotid arteries. Bilateral anterior cerebral arteries arise from LEFT A1-2 junction. Normal flow related enhancement of the anterior and middle cerebral arteries, including distal segments. 4 x 1 mm intact posteriorly directed wide neck aneurysm arising from LEFT middle cerebral artery bifurcation. Mild luminal irregularity of the mid A2 segments, associated with mild motion. No large vessel occlusion, high-grade stenosis. Posterior circulation: RIGHT vertebral artery is dominant. LEFT vertebral artery terminates in the posterior inferior cerebellar artery. Basilar artery is patent, with normal flow related enhancement of the main branch vessels. Fetal origin RIGHT posterior cerebral artery.  Normal flow related enhancement of the posterior cerebral arteries. No large vessel occlusion, high-grade stenosis, abnormal luminal irregularity, aneurysm. IMPRESSION: MRI HEAD: No acute intracranial process, specifically no acute ischemia. Old RIGHT thalamus lacunar infarcts. Stable mild chronic small vessel ischemic disease. MRA HEAD: No emergent large vessel occlusion or high-grade stenosis. 4 x 1 mm intact LEFT MCA bifurcation aneurysm. Mild luminal irregularity mid A2 segments likely representing motion artifact, less likely atherosclerosis. Electronically Signed   By: Elon Alas M.D.   On: 07/18/2015 22:13   Mr Brain Wo Contrast  07/18/2015  CLINICAL DATA:  RIGHT eye vision loss and  seizure. History of thalamus infarction, seizure disorder, hypertension, hyperlipidemia, positive ANA. EXAM: MRI HEAD WITHOUT CONTRAST MRA HEAD WITHOUT CONTRAST TECHNIQUE: Multiplanar, multiecho pulse sequences of the brain and surrounding structures were obtained without intravenous contrast. Angiographic images of the head were obtained using MRA technique without contrast. COMPARISON:  CT head July 18, 2015 at 1859 hours and MRI of the head August 15, 2014 FINDINGS: MRI HEAD FINDINGS The ventricles and sulci are normal for patient's age. No abnormal parenchymal signal, mass lesions, mass effect. No reduced diffusion to suggest acute ischemia. Old RIGHT thalamus lacunar infarct. A few scattered subcentimeter supratentorial white matter FLAIR T2 hyperintensities are similar. No susceptibility artifact to suggest hemorrhage. Symmetric size, morphology and signal of the hippocampi. No abnormal extra-axial fluid collections. No extra-axial masses though, contrast enhanced sequences would be more sensitive. Normal major intracranial vascular flow voids seen at the skull base. Ocular globes and orbital contents are unremarkable though not tailored for evaluation. No abnormal sellar expansion. No suspicious calvarial bone  marrow signal. Craniocervical junction maintained. Trace mastoid effusions. Paranasal sinuses are well-aerated. Patient is edentulous. MRA HEAD FINDINGS Anterior circulation: Normal flow related enhancement of the included cervical, petrous, cavernous and supraclinoid internal carotid arteries. Bilateral anterior cerebral arteries arise from LEFT A1-2 junction. Normal flow related enhancement of the anterior and middle cerebral arteries, including distal segments. 4 x 1 mm intact posteriorly directed wide neck aneurysm arising from LEFT middle cerebral artery bifurcation. Mild luminal irregularity of the mid A2 segments, associated with mild motion. No large vessel occlusion, high-grade stenosis. Posterior circulation: RIGHT vertebral artery is dominant. LEFT vertebral artery terminates in the posterior inferior cerebellar artery. Basilar artery is patent, with normal flow related enhancement of the main branch vessels. Fetal origin RIGHT posterior cerebral artery. Normal flow related enhancement of the posterior cerebral arteries. No large vessel occlusion, high-grade stenosis, abnormal luminal irregularity, aneurysm. IMPRESSION: MRI HEAD: No acute intracranial process, specifically no acute ischemia. Old RIGHT thalamus lacunar infarcts. Stable mild chronic small vessel ischemic disease. MRA HEAD: No emergent large vessel occlusion or high-grade stenosis. 4 x 1 mm intact LEFT MCA bifurcation aneurysm. Mild luminal irregularity mid A2 segments likely representing motion artifact, less likely atherosclerosis. Electronically Signed   By: Elon Alas M.D.   On: 07/18/2015 22:13    ____________________________________________   PROCEDURES  Procedure(s) performed: None  Critical Care performed: Yes, see critical care note(s)  CRITICAL CARE Performed by: Hinda Kehr   Total critical care time: 30 minutes  Critical care time was exclusive of separately billable procedures and treating other  patients.  Critical care was necessary to treat or prevent imminent or life-threatening deterioration.  Critical care was time spent personally by me on the following activities: development of treatment plan with patient and/or surrogate as well as nursing, discussions with consultants, evaluation of patient's response to treatment, examination of patient, obtaining history from patient or surrogate, ordering and performing treatments and interventions, ordering and review of laboratory studies, ordering and review of radiographic studies, pulse oximetry and re-evaluation of patient's condition.  ____________________________________________   INITIAL IMPRESSION / ASSESSMENT AND PLAN / ED COURSE  Pertinent labs & imaging results that were available during my care of the patient were reviewed by me and considered in my medical decision making (see chart for details).  I evaluated the patient as soon as he was brought back from the lobby as described above.  He is currently not seizing and he is a alert and oriented though ill-appearing so I  did not administer Ativan.  I ordered Keppra 1 g IV and seizure precautions.  We will evaluate him broadly and obtain an emergent noncontrast head CT to make sure there is no evidence of acute bleeding.  Additionally, I called and spoke by phone with Dr. George Ina, the ophthalmologist, given that the patient's presenting complaints involved visual loss or decreased vision in the right eye.  Initially my workup will be primarily neurological but he felt that if I obtained an MR brain without contrast that would be sufficient for the purposes of an ophthalmologic evaluation as well.  The current plan is that if the patient's MRI is negative for infarction, but the patient is still having visual loss in the right eye, he will come to the emergency department to evaluate him with slit lamp.  I updated the patient and his family about this plan and he is currently  going to receive his non-con head CT (7:01 PM).  ----------------------------------------- 7:48 PM on 07/18/2015 -----------------------------------------  Head CT non-acute.  Discussed by phone with Dr. Doy Mince (neurohospitalist) who recommended MRA Head non-con and MR Brain non-con, then follow up with ophtho as previously discussed.  Updated patient and family w/ plan.  Patient stable.  ----------------------------------------- 10:44 PM on 07/18/2015 -----------------------------------------  MRI and MRA show no acute findings.  I reassessed the patient and provided patient and family updated.  He looks much better and reports that he feels back to baseline.  Additionally, his vision is completely back to normal and his right eye with no residual symptoms.  I spoke by phone again with Dr. George Ina.  We both agree that there is no point in him coming to the emergency department to evaluate the patient given that his symptoms have resolved.  He feels that it is most likely the patient is suffering from amaurosis fugax of the right eye but there is no acute treatment right now and no indication for hospitalization.  I discussed all this with the patient and family and they are comfortable with the plan for outpatient follow up on Monday.  He will continue his ASA, and   I gave my usual and customary return precautions.     ____________________________________________  FINAL CLINICAL IMPRESSION(S) / ED DIAGNOSES  Final diagnoses:  Amaurosis fugax of right eye  Seizure ([place])      NEW MEDICATIONS STARTED DURING THIS VISIT:  Discharge Medication List as of 07/18/2015 11:16 PM        Note:  This document was prepared using Dragon voice recognition software and may include unintentional dictation errors.   Hinda Kehr, MD 07/19/15 [phone]

## 2015-07-18 NOTE — ED Notes (Signed)
Patient arrived to room 6 from triage, patient pale and diaphoretic. Patient wife states that patient has a hx/o brain aneurysm, on Wednesday patient stated that he could not see out of his right eye, patient has not felt well since that episode. This afternoon patient stated that he felt dizzy and just didn't feel well. On the way to the ER patient c/o vision trouble again.  Dr. Karma Greaser at bedside

## 2015-07-18 NOTE — ED Notes (Signed)
Pt is sleeping, presently. MRI checklist reviewed w/ 3 family members present. No contraindications or risk factors identified at this time.

## 2015-07-18 NOTE — ED Notes (Signed)
MRI [facility] returned call. Verified no surgery on brain aneurysm. Family updated on ETA for MRI

## 2015-07-18 NOTE — ED Notes (Signed)
Pt with known brain aneurysm with right eye vision changes 1.5 hrs ago. States he feels swimmy headed. Denies headache, weakness.

## 2015-07-18 NOTE — Discharge Instructions (Signed)
As we discussed, your workup was generally reassuring.  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  Continue to take your regular daily aspirin and all of your medications including your Keppra.  We believe the extra stress your body is currently under house will likely caused her seizure, but we also recommend that you cut back on your daily drinking over the next couple of weeks so that you can wean your self off the beer safely.  If you have any more visual disturbances, Dr. George Ina suggested you call him at the provided number (he can be paged through that number).  Otherwise follow up with him on Monday first thing in the morning either by going in to the office or by calling as soon as the office opens.  Either way, explain that Dr. George Ina said you need an appointment on Monday.  Return to the emergency department with any new or worsening symptoms that concern you.   Amaurosis Fugax Amaurosis fugax is a condition in which you lose your sight in one eye. The loss of vision in the affected eye may be total or partial. The vision loss usually lasts for only a few seconds or minutes before sight returns to normal. Occasionally, it may last for several hours. This condition is caused by interruption of blood flow to the artery that supplies blood to the part of your eye that contains the nerves that are needed for sight (retina). In some people, amaurosis fugax can be a sign of an increased risk for an actual stroke. A stroke can result in permanent vision loss or loss of other body functions. CAUSES This condition is caused by a loss of blood flow. This can be due to a buildup of cholesterol and fats (plaque) in the arteries or the heart. If some of that plaque comes off of the artery and gets into the bloodstream, it can flow to the artery that supplies blood to the retina and block the flow of blood to the retina. When that happens, vision is lost for as long as the blood  flow is interrupted. RISK FACTORS This condition is more likely to develop if risk factors are present, including:  Use of any tobacco products, including cigarettes, chewing tobacco, or electronic cigarettes.  Poorly controlled diabetes.  High blood pressure (hypertension).  High cholesterol.  Heart disease.  Diseases of the heart valves.  Certain diseases of the blood, such as sickle cell anemia and leukemia.  Blood clotting (coagulation) disorders.  Inflammation of the arteries.  An irregular heartbeat.  Excessive alcohol use.  Use of illegal drugs, especially cocaine.  Increasing age.  Family history of stroke. SYMPTOMS  The main symptom of this condition is painless, sudden loss of vision in one eye. The vision loss often starts at the top and moves down, as if a curtain is being pulled down over your eye. This is usually followed by a quick return of vision. DIAGNOSIS This condition is diagnosed by:  Medical history and physical exam.  Eye exam.  Carotid ultrasound. This checks to see if plaque has built up in the arteries in your neck or your brain (carotid artery).  Magnetic resonance angiography (MRA). This also checks your carotid artery. You may also have blood tests and an electrocardiogram (ECG) to check your heart. TREATMENT Treatment focuses on reducing your risk of having a stroke in the future. This may include:  Medicines.  Procedures to either remove plaque in your carotid arteries or widen carotid  arteries that have become narrow due to plaque. Those procedures are:  Carotid endarterectomy.  Carotid angioplasty and stenting. It is possible that you may not need any immediate treatment. HOME CARE INSTRUCTIONS Medicines  Take medicines only as directed by your health care provider. Follow the directions carefully. Medicines may be used to control risk factors for a stroke. Be sure that you understand all of your medicine  instructions. Eating  Eat a diet that includes five or more servings of fruits and vegetables each day. This may reduce the risk of stroke. Certain diets may be prescribed to deal with high blood pressure, high cholesterol, diabetes, or obesity.  A diet that is low in sodium is recommended to manage high blood pressure.  A high-fiber diet that is low in saturated fat, trans fat, and cholesterol may control cholesterol levels.  A controlled-carbohydrate, controlled-sugar diet is recommended to manage diabetes.  A reduced-calorie diet that is low in sodium, saturated fat, trans fat, and cholesterol is recommended to manage obesity. Lifestyle  Maintain a healthy weight.  Stay physically active. It is recommended that you get at least 30 minutes of activity on most or all days.  Do not use any tobacco products, including cigarettes, chewing tobacco, or electronic cigarettes. If you need help quitting, ask your health care provider.  Do not abuse drugs. General Instructions  Keep all follow-up visits as directed by your health care provider. This is important. SEEK MEDICAL CARE IF: You lose vision in one eye or both eyes for a short period of time. SEEK IMMEDIATE MEDICAL CARE IF:  You lose vision in one eye and it does not recover within a very short period of time.  You have sudden weakness or numbness of the face, arm, or leg, especially on one side of your body.  You have sudden trouble walking or difficulty moving your arms or legs.  You have sudden confusion.  You have trouble speaking, trouble understanding, or both (aphasia).  You have sudden trouble seeing with one eye or both eyes.  You have a loss of balance or coordination.  You have a sudden, severe headache with no known cause.  You have new chest pain or an irregular heartbeat.  You have a partial or total loss of consciousness. Any of these symptoms may represent a serious problem that is an emergency. Do not  wait to see if the symptoms will go away. Get medical help right away. Call your local emergency services (911 in the [place] Do not drive yourself to the hospital.   This information is not intended to replace advice given to you by your health care provider. Make sure you discuss any questions you have with your health care provider.   Document Released: 01/13/2008 Document Revised: 08/20/2014 Document Reviewed: 11/08/2013 Elsevier Interactive Patient Education [employer].  Seizure, Adult A seizure is abnormal electrical activity in the brain. Seizures usually last from 30 seconds to 2 minutes. There are various types of seizures. Before a seizure, you may have a warning sensation (aura) that a seizure is about to occur. An aura may include the following symptoms:   Fear or anxiety.  Nausea.  Feeling like the room is spinning (vertigo).  Vision changes, such as seeing flashing lights or spots. Common symptoms during a seizure include:  A change in attention or behavior (altered mental status).  Convulsions with rhythmic jerking movements.  Drooling.  Rapid eye movements.  Grunting.  Loss of bladder and bowel control.  Bitter  taste in the mouth.  Tongue biting. After a seizure, you may feel confused and sleepy. You may also have an injury resulting from convulsions during the seizure. HOME CARE INSTRUCTIONS   If you are given medicines, take them exactly as prescribed by your health care provider.  Keep all follow-up appointments as directed by your health care provider.  Do not swim or drive or engage in risky activity during which a seizure could cause further injury to you or others until your health care provider says it is OK.  Get adequate rest.  Teach friends and family what to do if you have a seizure. They should:  Lay you on the ground to prevent a fall.  Put a cushion under your head.  Loosen any tight clothing around your neck.  Turn you on  your side. If vomiting occurs, this helps keep your airway clear.  Stay with you until you recover.  Know whether or not you need emergency care. SEEK IMMEDIATE MEDICAL CARE IF:  The seizure lasts longer than 5 minutes.  The seizure is severe or you do not wake up immediately after the seizure.  You have an altered mental status after the seizure.  You are having more frequent or worsening seizures. Someone should drive you to the emergency department or call local emergency services (911 in [place] MAKE SURE YOU:  Understand these instructions.  Will watch your condition.  Will get help right away if you are not doing well or get worse.   This information is not intended to replace advice given to you by your health care provider. Make sure you discuss any questions you have with your health care provider.   Document Released: 04/02/2000 Document Revised: 04/26/2014 Document Reviewed: 11/15/2012 Elsevier Interactive Patient Education [employer].

## 2015-07-21 ENCOUNTER — Other Ambulatory Visit: Payer: Self-pay | Admitting: Ophthalmology

## 2015-07-21 DIAGNOSIS — G453 Amaurosis fugax: Secondary | ICD-10-CM

## 2015-07-23 ENCOUNTER — Ambulatory Visit
Admission: RE | Admit: 2015-07-23 | Discharge: 2015-07-23 | Disposition: A | Discharge status: Home / Self Care | Payer: BLUE CROSS/BLUE SHIELD | Source: Ambulatory Visit | Attending: Ophthalmology | Admitting: Ophthalmology

## 2015-07-23 DIAGNOSIS — G453 Amaurosis fugax: Secondary | ICD-10-CM | POA: Insufficient documentation

## 2015-07-23 DIAGNOSIS — I6523 Occlusion and stenosis of bilateral carotid arteries: Secondary | ICD-10-CM | POA: Insufficient documentation

## 2015-09-26 ENCOUNTER — Encounter: Payer: Self-pay | Admitting: Emergency Medicine

## 2015-09-26 ENCOUNTER — Emergency Department
Admission: EM | Admit: 2015-09-26 | Discharge: 2015-09-27 | Disposition: A | Discharge status: Home / Self Care | Payer: BLUE CROSS/BLUE SHIELD | Attending: Emergency Medicine | Admitting: Emergency Medicine

## 2015-09-26 DIAGNOSIS — Z79899 Other long term (current) drug therapy: Secondary | ICD-10-CM | POA: Diagnosis not present

## 2015-09-26 DIAGNOSIS — Z87891 Personal history of nicotine dependence: Secondary | ICD-10-CM | POA: Diagnosis not present

## 2015-09-26 DIAGNOSIS — L509 Urticaria, unspecified: Secondary | ICD-10-CM

## 2015-09-26 DIAGNOSIS — I10 Essential (primary) hypertension: Secondary | ICD-10-CM | POA: Insufficient documentation

## 2015-09-26 DIAGNOSIS — Z8669 Personal history of other diseases of the nervous system and sense organs: Secondary | ICD-10-CM | POA: Insufficient documentation

## 2015-09-26 DIAGNOSIS — Z91013 Allergy to seafood: Secondary | ICD-10-CM | POA: Diagnosis not present

## 2015-09-26 DIAGNOSIS — E785 Hyperlipidemia, unspecified: Secondary | ICD-10-CM | POA: Insufficient documentation

## 2015-09-26 DIAGNOSIS — R21 Rash and other nonspecific skin eruption: Secondary | ICD-10-CM

## 2015-09-26 DIAGNOSIS — Z8673 Personal history of transient ischemic attack (TIA), and cerebral infarction without residual deficits: Secondary | ICD-10-CM | POA: Insufficient documentation

## 2015-09-26 HISTORY — DX: Cerebral infarction, unspecified: I63.9

## 2015-09-26 MED ORDER — DIPHENHYDRAMINE HCL 50 MG/ML IJ SOLN
50.0000 mg | Freq: Once | INTRAMUSCULAR | Status: DC
  Filled 2015-09-26: qty 1

## 2015-09-26 MED ORDER — DIPHENHYDRAMINE HCL 50 MG/ML IJ SOLN
50.0000 mg | Freq: Once | INTRAMUSCULAR | Status: AC
  Administered 2015-09-26: 50 mg via INTRAMUSCULAR

## 2015-09-26 NOTE — ED Provider Notes (Signed)
[facility] Emergency Department Provider Note  ____________________________________________  Time seen: Approximately 11:07 PM  I have reviewed the triage vital signs and the nursing notes.   HISTORY  Chief Complaint Rash    HPI Hunter Weber is a 49 y.o. male , NAD, presents to the emergency department with several hour history of rash about the left lateral elbow. States it started suddenly earlier this evening. Has applied cortisone cream as well as Benadryl cream with no resolution of the itching. He states it feels like "something is crawling in my skin". Has no known exposures to environmental allergens nor anyone with similar symptoms. Has not had any changes in medications, lotions, soaps, detergents. Does have a portable EEG attached to what she is having to wear until Monday. It was applied this morning. Denies anything touching his arm. Denies headache, visual changes, open wounds or lacerations. Has not noted any oozing or weeping. Denies any blisters or vesicles. Does have a history of stroke and seizure disorder but is not having any symptoms related to such at this time.   Past Medical History  Diagnosis Date  . Hyperlipidemia   . Hypertension   . Other convulsions   . Seizure disorder ([place])   . History of depression   . Low libido   . Positive ANA (antinuclear antibody)   . Stroke [facility])     Patient Active Problem List   Diagnosis Date Noted  . Chest pain 05/05/2012  . Hyperlipidemia 05/05/2012  . Essential hypertension 05/05/2012  . Smoking history 05/05/2012    Past Surgical History  Procedure Laterality Date  . Mandible fracture surgery      Current Outpatient Rx  Name  Route  Sig  Dispense  Refill  . fenofibrate 160 MG tablet   Oral   Take 1 tablet (160 mg total) by mouth daily.   90 tablet   3   . FLUoxetine (PROZAC) 20 MG tablet   Oral   Take 20 mg by mouth daily.         Marland Kitchen levETIRAcetam (KEPPRA) 500 MG tablet    Oral   Take 500 mg by mouth 2 (two) times daily.         Marland Kitchen losartan (COZAAR) 100 MG tablet   Oral   Take 100 mg by mouth daily.         Marland Kitchen lovastatin (MEVACOR) 20 MG tablet   Oral   Take 1 tablet (20 mg total) by mouth at bedtime.   90 tablet   3   . nitroGLYCERIN (NITROSTAT) 0.4 MG SL tablet   Sublingual   Place 1 tablet (0.4 mg total) under the tongue every 5 (five) minutes as needed for chest pain.   25 tablet   6     Allergies Lisinopril; Penicillins; and Shellfish allergy  Family History  Problem Relation Age of Onset  . Hypertension Mother   . Heart disease Father   . Heart attack Father   . Heart attack Brother     Social History Social History  Substance Use Topics  . Smoking status: Former Smoker -- 2.00 packs/day for 20 years    Types: Cigarettes  . Smokeless tobacco: None  . Alcohol Use: No     Review of Systems  Constitutional: No fever/chills, fatigue Eyes: No visual changes. Cardiovascular: No chest pain. Respiratory: No cough. No shortness of breath. No wheezing.  Gastrointestinal: No abdominal pain.  No nausea, vomiting Musculoskeletal: Negative for general myalgias.  Skin:  Positive for puritic rash. Neurological: Negative for headaches, focal weakness or numbness. Has chronic tingling an numbness of left index and middle fingers that is unchanged.  10-point ROS otherwise negative.  ____________________________________________   PHYSICAL EXAM:  VITAL SIGNS: ED Triage Vitals  Enc Vitals Group     BP 09/26/15 2222 137/82 mmHg     Pulse Rate 09/26/15 2222 74     Resp 09/26/15 2222 18     Temp 09/26/15 2222 97.9 F (36.6 C)     Temp Source 09/26/15 2222 Oral     SpO2 09/26/15 2222 100 %     Weight 09/26/15 2222 250 lb (113.399 kg)     Height 09/26/15 2222 5\' 8"  (1.727 m)     Head Cir --      Peak Flow --      Pain Score --      Pain Loc --      Pain Edu? --      Excl. in [place]? --      Constitutional: Alert and oriented. Well  appearing and in no acute distress. Eyes: Conjunctivae are normal.  Head: Atraumatic. Neck: Supple with full range of motion. Hematological/Lymphatic/Immunilogical: No cervical lymphadenopathy. Cardiovascular:  Good peripheral circulation with 2+ pulses noted in the left upper extremity. Capillary refill is brisk in left upper extremity Respiratory: Normal respiratory effort without tachypnea or retractions.  Neurologic:  Normal speech and language. No gross focal neurologic deficits are appreciated.  Skin:  Skin about the lateral elbow, distal upper arm and proximal forearm erythematous with 3 areas of excoriation from the patient scratching. No warmth to the skin. No wheals, vesicles. Skin does not blanch. No tenderness to palpation of the area. Skin is warm, dry and intact.  Psychiatric: Mood and affect are normal. Speech and behavior are normal. Patient exhibits appropriate insight and judgement.   ____________________________________________   LABS  None ____________________________________________  EKG  None ____________________________________________  RADIOLOGY  None ____________________________________________    PROCEDURES  Procedure(s) performed: None    Medications  diphenhydrAMINE (BENADRYL) injection 50 mg (50 mg Intramuscular Given 09/26/15 2332)     ____________________________________________   INITIAL IMPRESSION / ASSESSMENT AND PLAN / ED COURSE  Patient's diagnosis is consistent with Rash and nonspecific skin eruption causing urticaria. Patient will be discharged home with Instructions to take 25-50 mg of Benadryl every 6 hours as needed for itching. May apply cool compress to affected area as needed to decrease itching. Advise not to scratch the area. Patient is to follow up with his primary care provider if symptoms persist past this treatment course. Patient is given ED precautions to return to the ED for any worsening or new symptoms.       ____________________________________________  FINAL CLINICAL IMPRESSION(S) / ED DIAGNOSES  Final diagnoses:  Rash and nonspecific skin eruption  Urticaria      NEW MEDICATIONS STARTED DURING THIS VISIT:  New Prescriptions   No medications on file         Braxton Feathers, PA-C 09/27/15 0006  Lavonia Drafts, MD 09/27/15 1616

## 2015-09-26 NOTE — ED Notes (Signed)
Patient developed a rash to left upper arm today. Patient states that he has tried cortisone and benadryl cream with no improvement.

## 2015-09-27 NOTE — Discharge Instructions (Signed)
May take 25-50mg  Benadryl by mouth every 6 hours as needed for itching. Apply cold compress to affected area to decrease itching.  Please see your PCP on Monday if not improved.

## 2015-10-07 ENCOUNTER — Emergency Department
Admission: EM | Admit: 2015-10-07 | Discharge: 2015-10-07 | Disposition: A | Discharge status: Home / Self Care | Payer: BLUE CROSS/BLUE SHIELD | Attending: Emergency Medicine | Admitting: Emergency Medicine

## 2015-10-07 ENCOUNTER — Encounter: Payer: Self-pay | Admitting: Emergency Medicine

## 2015-10-07 ENCOUNTER — Other Ambulatory Visit: Payer: Self-pay

## 2015-10-07 ENCOUNTER — Emergency Department: Payer: BLUE CROSS/BLUE SHIELD

## 2015-10-07 DIAGNOSIS — R791 Abnormal coagulation profile: Secondary | ICD-10-CM | POA: Insufficient documentation

## 2015-10-07 DIAGNOSIS — Z79899 Other long term (current) drug therapy: Secondary | ICD-10-CM | POA: Diagnosis not present

## 2015-10-07 DIAGNOSIS — R531 Weakness: Secondary | ICD-10-CM

## 2015-10-07 DIAGNOSIS — R42 Dizziness and giddiness: Secondary | ICD-10-CM | POA: Insufficient documentation

## 2015-10-07 DIAGNOSIS — R5383 Other fatigue: Secondary | ICD-10-CM | POA: Diagnosis present

## 2015-10-07 DIAGNOSIS — Z87891 Personal history of nicotine dependence: Secondary | ICD-10-CM | POA: Diagnosis not present

## 2015-10-07 DIAGNOSIS — Z91013 Allergy to seafood: Secondary | ICD-10-CM | POA: Insufficient documentation

## 2015-10-07 DIAGNOSIS — Z8673 Personal history of transient ischemic attack (TIA), and cerebral infarction without residual deficits: Secondary | ICD-10-CM | POA: Insufficient documentation

## 2015-10-07 DIAGNOSIS — M6281 Muscle weakness (generalized): Secondary | ICD-10-CM | POA: Insufficient documentation

## 2015-10-07 DIAGNOSIS — I10 Essential (primary) hypertension: Secondary | ICD-10-CM | POA: Insufficient documentation

## 2015-10-07 DIAGNOSIS — Z8659 Personal history of other mental and behavioral disorders: Secondary | ICD-10-CM | POA: Insufficient documentation

## 2015-10-07 DIAGNOSIS — E785 Hyperlipidemia, unspecified: Secondary | ICD-10-CM | POA: Diagnosis not present

## 2015-10-07 DIAGNOSIS — Z8669 Personal history of other diseases of the nervous system and sense organs: Secondary | ICD-10-CM | POA: Insufficient documentation

## 2015-10-07 HISTORY — DX: Bell's palsy: G51.0

## 2015-10-07 LAB — COMPREHENSIVE METABOLIC PANEL
ALBUMIN: 4.9 g/dL (ref 3.5–5.0)
ALT: 23 U/L (ref 17–63)
AST: 28 U/L (ref 15–41)
Alkaline Phosphatase: 62 U/L (ref 38–126)
Anion gap: 9 (ref 5–15)
BUN: 15 mg/dL (ref 6–20)
CHLORIDE: 101 mmol/L (ref 101–111)
CO2: 25 mmol/L (ref 22–32)
CREATININE: 1.52 mg/dL — AB (ref 0.61–1.24)
Calcium: 9.5 mg/dL (ref 8.9–10.3)
GFR calc Af Amer: 59 mL/min — ABNORMAL LOW (ref >60)
GFR calc non Af Amer: 51 mL/min — ABNORMAL LOW (ref >60)
GLUCOSE: 99 mg/dL (ref 65–99)
Potassium: 3.8 mmol/L (ref 3.5–5.1)
SODIUM: 135 mmol/L (ref 135–145)
Total Bilirubin: 1.2 mg/dL (ref 0.3–1.2)
Total Protein: 7.8 g/dL (ref 6.5–8.1)

## 2015-10-07 LAB — PROTIME-INR
INR: 1.16
PROTHROMBIN TIME: 15 s (ref 11.4–15.0)

## 2015-10-07 LAB — DIFFERENTIAL
BASOS ABS: 0.1 10*3/uL (ref 0–0.1)
Eosinophils Absolute: 0.1 10*3/uL (ref 0–0.7)
Eosinophils Relative: 2 %
Lymphocytes Relative: 25 %
Lymphs Abs: 2.1 10*3/uL (ref 1.0–3.6)
Monocytes Absolute: 0.7 10*3/uL (ref 0.2–1.0)
Monocytes Relative: 9 %
NEUTROS ABS: 5.3 10*3/uL (ref 1.4–6.5)
Neutrophils Relative %: 63 %

## 2015-10-07 LAB — CBC
HCT: 44.6 % (ref 40.0–52.0)
Hemoglobin: 16.2 g/dL (ref 13.0–18.0)
MCH: 32.1 pg (ref 26.0–34.0)
MCHC: 36.3 g/dL — ABNORMAL HIGH (ref 32.0–36.0)
MCV: 88.5 fL (ref 80.0–100.0)
PLATELETS: 213 10*3/uL (ref 150–440)
RBC: 5.05 MIL/uL (ref 4.40–5.90)
RDW: 12.7 % (ref 11.5–14.5)
WBC: 8.3 10*3/uL (ref 3.8–10.6)

## 2015-10-07 LAB — TROPONIN I: Troponin I: <0.03 ng/mL (ref <0.031)

## 2015-10-07 LAB — APTT: APTT: 35 s (ref 24–36)

## 2015-10-07 NOTE — Discharge Instructions (Signed)
Weakness °Weakness is a lack of strength. It may be felt all over the body (generalized) or in one specific part of the body (focal). Some causes of weakness can be serious. You may need further medical evaluation, especially if you are elderly or you have a history of immunosuppression (such as chemotherapy or HIV), kidney disease, heart disease, or diabetes. °CAUSES  °Weakness can be caused by many different things, including: °· Infection. °· Physical exhaustion. °· Internal bleeding or other blood loss that results in a lack of red blood cells (anemia). °· Dehydration. This cause is more common in elderly people. °· Side effects or electrolyte abnormalities from medicines, such as pain medicines or sedatives. °· Emotional distress, anxiety, or depression. °· Circulation problems, especially severe peripheral arterial disease. °· Heart disease, such as rapid atrial fibrillation, bradycardia, or heart failure. °· Nervous system disorders, such as Guillain-Barré syndrome, multiple sclerosis, or stroke. °DIAGNOSIS  °To find the cause of your weakness, your caregiver will take your history and perform a physical exam. Lab tests or X-rays may also be ordered, if needed. °TREATMENT  °Treatment of weakness depends on the cause of your symptoms and can vary greatly. °HOME CARE INSTRUCTIONS  °· Rest as needed. °· Eat a well-balanced diet. °· Try to get some exercise every day. °· Only take over-the-counter or prescription medicines as directed by your caregiver. °SEEK MEDICAL CARE IF:  °· Your weakness seems to be getting worse or spreads to other parts of your body. °· You develop new aches or pains. °SEEK IMMEDIATE MEDICAL CARE IF:  °· You cannot perform your normal daily activities, such as getting dressed and feeding yourself. °· You cannot walk up and down stairs, or you feel exhausted when you do so. °· You have shortness of breath or chest pain. °· You have difficulty moving parts of your body. °· You have weakness  in only one area of the body or on only one side of the body. °· You have a fever. °· You have trouble speaking or swallowing. °· You cannot control your bladder or bowel movements. °· You have black or bloody vomit or stools. °MAKE SURE YOU: °· Understand these instructions. °· Will watch your condition. °· Will get help right away if you are not doing well or get worse. °  °This information is not intended to replace advice given to you by your health care provider. Make sure you discuss any questions you have with your health care provider. °  °Document Released: 04/05/2005 Document Revised: 10/05/2011 Document Reviewed: 06/04/2011 °Elsevier Interactive Patient Education ©2016 [employer] ° °Fatigue °Fatigue is feeling tired all of the time, a lack of energy, or a lack of motivation. Occasional or mild fatigue is often a normal response to activity or life in general. However, long-lasting (chronic) or extreme fatigue may indicate an underlying medical condition. °HOME CARE INSTRUCTIONS  °Watch your fatigue for any changes. The following actions may help to lessen any discomfort you are feeling: °· Talk to your health care provider about how much sleep you need each night. Try to get the required amount every night. °· Take medicines only as directed by your health care provider. °· Eat a healthy and nutritious diet. Ask your health care provider if you need help changing your diet. °· Drink enough fluid to keep your urine clear or pale yellow. °· Practice ways of relaxing, such as yoga, meditation, massage therapy, or acupuncture. °· Exercise regularly.   °· Change situations that cause you   stress. Try to keep your work and personal routine reasonable. °· Do not abuse illegal drugs. °· Limit alcohol intake to no more than 1 drink per day for nonpregnant women and 2 drinks per day for men. One drink equals 12 ounces of beer, 5 ounces of wine, or 1½ ounces of hard liquor. °· Take a multivitamin, if directed by  your health care provider. °SEEK MEDICAL CARE IF:  °· Your fatigue does not get better. °· You have a fever.   °· You have unintentional weight loss or gain. °· You have headaches.   °· You have difficulty:   °¨ Falling asleep. °¨ Sleeping throughout the night. °· You feel angry, guilty, anxious, or sad.    °· You are unable to have a bowel movement (constipation).   °· You skin is dry.    °· Your legs or another part of your body is swollen.   °SEEK IMMEDIATE MEDICAL CARE IF:  °· You feel confused.   °· Your vision is blurry. °· You feel faint or pass out.   °· You have a severe headache.   °· You have severe abdominal, pelvic, or back pain.   °· You have chest pain, shortness of breath, or an irregular or fast heartbeat.   °· You are unable to urinate or you urinate less than normal.   °· You develop abnormal bleeding, such as bleeding from the rectum, vagina, nose, lungs, or nipples. °· You vomit blood.    °· You have thoughts about harming yourself or committing suicide.   °· You are worried that you might harm someone else.   °  °This information is not intended to replace advice given to you by your health care provider. Make sure you discuss any questions you have with your health care provider. °  °Document Released: 01/31/2007 Document Revised: 04/26/2014 Document Reviewed: 08/07/2013 °Elsevier Interactive Patient Education ©2016 [employer] ° °

## 2015-10-07 NOTE — ED Notes (Addendum)
Patient states for the past two days he has been very lethargic. Wife states patient has HX of stroke and has a known aneurysm for the past year. Pt reports being on benadryl for a couple of days due to a allergic reaction but not has taken any in two days. Pt states these symptoms began after stopping the benadryl. Denies N/V or dizziness

## 2015-10-07 NOTE — ED Notes (Signed)
Patient reports, "I got a funny feeling in my head two days ago."  Patient reports feeling tired and weak.  Patient states, "all I want to do is sleep."  Patient states feeling began after he stopped taking benadryl after he had been taking benadryl for several days due to a rash.  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

## 2015-10-07 NOTE — ED Provider Notes (Signed)
[facility] Emergency Department Provider Note  ____________________________________________  Time seen: 6:25 PM  I have reviewed the triage vital signs and the nursing notes.   HISTORY  Chief Complaint Fatigue and Weakness    HPI Hunter Weber is a 50 y.o. male who complains of fatigue for the past 2 days. He also reports a funny feeling in his head but cannot elaborate further. It's not described as a pain. No vision changes. No nausea vomiting or dizziness. No trauma. He has a history of stroke, seizure, and a cerebral aneurysm. About a month ago his Keppra level was increased due to a seizure that patient and his wife think was also provoked by relapse of drinking. He has not had any recent drinkingsince then. He was taking Benadryl over the last week due to pain allergic reaction, which she stopped 2 days ago when his unusual symptoms started.  Patient specifically denies headache. No vomiting     Past Medical History  Diagnosis Date  . Hyperlipidemia   . Hypertension   . Other convulsions   . Seizure disorder ([place])   . History of depression   . Low libido   . Positive ANA (antinuclear antibody)   . Stroke ([place])   . Bell's palsy      Patient Active Problem List   Diagnosis Date Noted  . Chest pain 05/05/2012  . Hyperlipidemia 05/05/2012  . Essential hypertension 05/05/2012  . Smoking history 05/05/2012     Past Surgical History  Procedure Laterality Date  . Mandible fracture surgery    . Cerebral angiogram       Current Outpatient Rx  Name  Route  Sig  Dispense  Refill  . fenofibrate 160 MG tablet   Oral   Take 1 tablet (160 mg total) by mouth daily.   90 tablet   3   . FLUoxetine (PROZAC) 20 MG tablet   Oral   Take 20 mg by mouth daily.         Marland Kitchen levETIRAcetam (KEPPRA) 500 MG tablet   Oral   Take 500 mg by mouth 2 (two) times daily.         Marland Kitchen losartan (COZAAR) 100 MG tablet   Oral   Take 100 mg by mouth daily.          Marland Kitchen lovastatin (MEVACOR) 20 MG tablet   Oral   Take 1 tablet (20 mg total) by mouth at bedtime.   90 tablet   3   . nitroGLYCERIN (NITROSTAT) 0.4 MG SL tablet   Sublingual   Place 1 tablet (0.4 mg total) under the tongue every 5 (five) minutes as needed for chest pain.   25 tablet   6      Allergies Lisinopril; Penicillins; and Shellfish allergy   Family History  Problem Relation Age of Onset  . Hypertension Mother   . Heart disease Father   . Heart attack Father   . Heart attack Brother     Social History Social History  Substance Use Topics  . Smoking status: Former Smoker -- 2.00 packs/day for 20 years    Types: Cigarettes  . Smokeless tobacco: None  . Alcohol Use: No    Review of Systems  Constitutional:   No fever or chills.  Eyes:   No vision changes.  ENT:   No sore throat. No rhinorrhea. Cardiovascular:   No chest pain. Respiratory:   No dyspnea or cough. Gastrointestinal:   Negative for abdominal pain, vomiting  and diarrhea.  Genitourinary:   Negative for dysuria or difficulty urinating. Musculoskeletal:   Negative for focal pain or swelling Neurological:   Negative for headaches 10-point ROS otherwise negative.  ____________________________________________   PHYSICAL EXAM:  VITAL SIGNS: ED Triage Vitals  Enc Vitals Group     BP 10/07/15 1541 121/90 mmHg     Pulse Rate 10/07/15 1541 88     Resp 10/07/15 1541 18     Temp 10/07/15 1541 97.9 F (36.6 C)     Temp Source 10/07/15 1541 Oral     SpO2 10/07/15 1541 96 %     Weight 10/07/15 1541 240 lb (108.863 kg)     Height 10/07/15 1541 5\' 8"  (1.727 m)     Head Cir --      Peak Flow --      Pain Score 10/07/15 1541 0     Pain Loc --      Pain Edu? --      Excl. in [place]? --     Vital signs reviewed, nursing assessments reviewed.   Constitutional:   Alert and oriented. Well appearing and in no distress. Eyes:   No scleral icterus. No conjunctival pallor. PERRL. EOMI.  No  nystagmus. ENT   Head:   Normocephalic and atraumatic.   Nose:   No congestion/rhinnorhea. No septal hematoma   Mouth/Throat:   MMM, no pharyngeal erythema. No peritonsillar mass.    Neck:   No stridor. No SubQ emphysema. No meningismus. Hematological/Lymphatic/Immunilogical:   No cervical lymphadenopathy. Cardiovascular:   RRR. Symmetric bilateral radial and DP pulses.  No murmurs.  Respiratory:   Normal respiratory effort without tachypnea nor retractions. Breath sounds are clear and equal bilaterally. No wheezes/rales/rhonchi. Gastrointestinal:   Soft and nontender. Non distended. There is no CVA tenderness.  No rebound, rigidity, or guarding. Genitourinary:   deferred Musculoskeletal:   Nontender with normal range of motion in all extremities. No joint effusions.  No lower extremity tenderness.  No edema. Neurologic:   Normal speech and language.  CN 2-10 normal. Left upper extremities slightly weaker than the right, consistent with chronic baseline, unchanged per patient. Full strength in right upper extremity and bilateral lower extremities Normal gait No pronator drift No gross focal neurologic deficits are appreciated.  Skin:    Skin is warm, dry and intact. No rash noted.  No petechiae, purpura, or bullae.  ____________________________________________    LABS (pertinent positives/negatives) (all labs ordered are listed, but only abnormal results are displayed) Labs Reviewed  CBC - Abnormal; Notable for the following:    MCHC 36.3 (*)    All other components within normal limits  COMPREHENSIVE METABOLIC PANEL - Abnormal; Notable for the following:    Creatinine, Ser 1.52 (*)    GFR calc non Af Amer 51 (*)    GFR calc Af Amer 59 (*)    All other components within normal limits  PROTIME-INR  APTT  DIFFERENTIAL  TROPONIN I  CBG MONITORING, ED   ____________________________________________   EKG  Interpreted by me  Date: 10/07/2015  Rate: 76  Rhythm:  normal sinus rhythm  QRS Axis: normal  Intervals: normal  ST/T Wave abnormalities: normal  Conduction Disutrbances: none  Narrative Interpretation: unremarkable      ____________________________________________    RADIOLOGY  CT head unremarkable  ____________________________________________   PROCEDURES   ____________________________________________   INITIAL IMPRESSION / ASSESSMENT AND PLAN / ED COURSE  Pertinent labs & imaging results that were available during my care of the  patient were reviewed by me and considered in my medical decision making (see chart for details).  Patient well appearing no acute distress, unremarkable vital signs. Presents with decreased energy level over the last 2 days with no other symptoms. Possibly adverse drug reaction from Keppra and Benadryl and some interaction of the 2.Considering the patient's symptoms, medical history, and physical examination today, I have low suspicion for ischemic stroke, intracranial hemorrhage, meningitis, encephalitis, carotid or vertebral dissection, venous sinus thrombosis, MS, intracranial hypertension, glaucoma, CRAO, CRVO, or temporal arteritis. Patient will follow-up with neurology tomorrow for Keppra level check and repeat evaluation. This does not appear to be related to his underlying history of aneurysm or strokes.     ____________________________________________   FINAL CLINICAL IMPRESSION(S) / ED DIAGNOSES  Final diagnoses:  Generalized weakness       Portions of this note were generated with dragon dictation software. Dictation errors may occur despite best attempts at proofreading.   Carrie Mew, MD 10/07/15 [phone]

## 2016-02-12 ENCOUNTER — Other Ambulatory Visit: Payer: Self-pay

## 2016-02-12 ENCOUNTER — Emergency Department: Payer: BLUE CROSS/BLUE SHIELD

## 2016-02-12 ENCOUNTER — Encounter: Payer: Self-pay | Admitting: Emergency Medicine

## 2016-02-12 ENCOUNTER — Emergency Department
Admission: EM | Admit: 2016-02-12 | Discharge: 2016-02-12 | Disposition: A | Discharge status: Home / Self Care | Payer: BLUE CROSS/BLUE SHIELD | Attending: Student in an Organized Health Care Education/Training Program | Admitting: Student in an Organized Health Care Education/Training Program

## 2016-02-12 DIAGNOSIS — K824 Cholesterolosis of gallbladder: Secondary | ICD-10-CM | POA: Insufficient documentation

## 2016-02-12 DIAGNOSIS — R1011 Right upper quadrant pain: Secondary | ICD-10-CM | POA: Diagnosis present

## 2016-02-12 DIAGNOSIS — Z79899 Other long term (current) drug therapy: Secondary | ICD-10-CM | POA: Diagnosis not present

## 2016-02-12 DIAGNOSIS — R519 Headache, unspecified: Secondary | ICD-10-CM

## 2016-02-12 DIAGNOSIS — I10 Essential (primary) hypertension: Secondary | ICD-10-CM | POA: Insufficient documentation

## 2016-02-12 DIAGNOSIS — Z87891 Personal history of nicotine dependence: Secondary | ICD-10-CM | POA: Insufficient documentation

## 2016-02-12 DIAGNOSIS — R51 Headache: Secondary | ICD-10-CM | POA: Insufficient documentation

## 2016-02-12 LAB — COMPREHENSIVE METABOLIC PANEL
ALK PHOS: 60 U/L (ref 38–126)
ALT: 26 U/L (ref 17–63)
AST: 30 U/L (ref 15–41)
Albumin: 4.8 g/dL (ref 3.5–5.0)
Anion gap: 9 (ref 5–15)
BUN: 15 mg/dL (ref 6–20)
CALCIUM: 10 mg/dL (ref 8.9–10.3)
CHLORIDE: 100 mmol/L — AB (ref 101–111)
CO2: 28 mmol/L (ref 22–32)
CREATININE: 1.87 mg/dL — AB (ref 0.61–1.24)
GFR, EST AFRICAN AMERICAN: 46 mL/min — AB (ref >60)
GFR, EST NON AFRICAN AMERICAN: 40 mL/min — AB (ref >60)
Glucose, Bld: 92 mg/dL (ref 65–99)
Potassium: 4.2 mmol/L (ref 3.5–5.1)
SODIUM: 137 mmol/L (ref 135–145)
Total Bilirubin: 1 mg/dL (ref 0.3–1.2)
Total Protein: 7.9 g/dL (ref 6.5–8.1)

## 2016-02-12 LAB — CBC
HCT: 48.6 % (ref 40.0–52.0)
Hemoglobin: 16.8 g/dL (ref 13.0–18.0)
MCH: 31.4 pg (ref 26.0–34.0)
MCHC: 34.5 g/dL (ref 32.0–36.0)
MCV: 91.2 fL (ref 80.0–100.0)
PLATELETS: 222 10*3/uL (ref 150–440)
RBC: 5.33 MIL/uL (ref 4.40–5.90)
RDW: 13.2 % (ref 11.5–14.5)
WBC: 9.1 10*3/uL (ref 3.8–10.6)

## 2016-02-12 LAB — URINALYSIS COMPLETE WITH MICROSCOPIC (ARMC ONLY)
BILIRUBIN URINE: NEGATIVE
Bacteria, UA: NONE SEEN
GLUCOSE, UA: NEGATIVE mg/dL
Hgb urine dipstick: NEGATIVE
KETONES UR: NEGATIVE mg/dL
Leukocytes, UA: NEGATIVE
Nitrite: NEGATIVE
PH: 7 (ref 5.0–8.0)
Protein, ur: 100 mg/dL — AB
Specific Gravity, Urine: 1.027 (ref 1.005–1.030)

## 2016-02-12 LAB — LIPASE, BLOOD: LIPASE: 23 U/L (ref 11–51)

## 2016-02-12 MED ORDER — DICYCLOMINE HCL 10 MG PO CAPS: 10.0000 mg | ORAL_CAPSULE | Freq: Four times a day (QID) | ORAL | 0 refills | Status: DC

## 2016-02-12 MED ORDER — DICYCLOMINE HCL 10 MG PO CAPS
10.0000 mg | ORAL_CAPSULE | Freq: Once | ORAL | Status: AC
  Administered 2016-02-12: 10 mg via ORAL
  Filled 2016-02-12: qty 1

## 2016-02-12 MED ORDER — PROCHLORPERAZINE MALEATE 10 MG PO TABS
10.0000 mg | ORAL_TABLET | Freq: Once | ORAL | Status: DC
  Filled 2016-02-12: qty 1

## 2016-02-12 MED ORDER — HYDROCODONE-ACETAMINOPHEN 5-325 MG PO TABS: 1.0000 | ORAL_TABLET | ORAL | 0 refills | Status: DC | PRN

## 2016-02-12 MED ORDER — ACETAMINOPHEN 325 MG PO TABS
650.0000 mg | ORAL_TABLET | Freq: Once | ORAL | Status: AC
  Administered 2016-02-12: 650 mg via ORAL
  Filled 2016-02-12: qty 2

## 2016-02-12 MED ORDER — DIPHENHYDRAMINE HCL 25 MG PO CAPS
25.0000 mg | ORAL_CAPSULE | Freq: Once | ORAL | Status: AC
  Administered 2016-02-12: 25 mg via ORAL
  Filled 2016-02-12: qty 1

## 2016-02-12 NOTE — ED Notes (Signed)
Patient transported to radiology

## 2016-02-12 NOTE — ED Triage Notes (Signed)
Brought [place] with right upper quad pain and headache.

## 2016-02-12 NOTE — ED Provider Notes (Signed)
[facility] Emergency Department Provider Note    First MD Initiated Contact with Patient 02/12/16 1328     (approximate)  I have reviewed the triage vital signs and the nursing notes.   HISTORY  Chief Complaint Abdominal Pain and Headache    HPI Hunter Weber is a 50 y.o. male presents with roughly 1 week of right upper quadrant intermittent colicky pain without radiation as well as a dull achy right sided headache. Patient states that he had a history of aneurysm and describes the headache as mild. No associated numbness or tingling. No photophobia or visual disturbances. Patient denies any vomiting but does admit to nausea. Does have a history of reflux but has not had any diarrhea or constipation. Still has his gallbladder. Denies any change in the symptoms with eating. No measured fevers. No shortness of breath or chest pain. No lower extremity swelling.   Past Medical History:  Diagnosis Date  . Bell's palsy   . History of depression   . Hyperlipidemia   . Hypertension   . Low libido   . Other convulsions   . Positive ANA (antinuclear antibody)   . Seizure disorder ([place])   . Stroke [facility])     Patient Active Problem List   Diagnosis Date Noted  . Chest pain 05/05/2012  . Hyperlipidemia 05/05/2012  . Essential hypertension 05/05/2012  . Smoking history 05/05/2012    Past Surgical History:  Procedure Laterality Date  . CEREBRAL ANGIOGRAM    . MANDIBLE FRACTURE SURGERY      Prior to Admission medications   Medication Sig Start Date End Date Taking? Authorizing Provider  fenofibrate 160 MG tablet Take 1 tablet (160 mg total) by mouth daily. 05/07/13   Minna Merritts, MD  FLUoxetine (PROZAC) 20 MG tablet Take 20 mg by mouth daily.    Historical Provider, MD  levETIRAcetam (KEPPRA) 500 MG tablet Take 500 mg by mouth 2 (two) times daily.    Historical Provider, MD  losartan (COZAAR) 100 MG tablet Take 100 mg by mouth daily.    Historical  Provider, MD  lovastatin (MEVACOR) 20 MG tablet Take 1 tablet (20 mg total) by mouth at bedtime. 05/05/12   Minna Merritts, MD  nitroGLYCERIN (NITROSTAT) 0.4 MG SL tablet Place 1 tablet (0.4 mg total) under the tongue every 5 (five) minutes as needed for chest pain. 05/05/12   Minna Merritts, MD    Allergies Lisinopril; Penicillins; and Shellfish allergy  Family History  Problem Relation Age of Onset  . Hypertension Mother   . Heart disease Father   . Heart attack Father   . Heart attack Brother     Social History Social History  Substance Use Topics  . Smoking status: Former Smoker    Packs/day: 2.00    Years: 20.00    Types: Cigarettes  . Smokeless tobacco: Never Used  . Alcohol use No    Review of Systems Patient denies headaches, rhinorrhea, blurry vision, numbness, shortness of breath, chest pain, edema, cough, abdominal pain, nausea, vomiting, diarrhea, dysuria, fevers, rashes or hallucinations unless otherwise stated above in HPI. ____________________________________________   PHYSICAL EXAM:  VITAL SIGNS: Vitals:   02/12/16 1102  BP: 108/73  Pulse: 86  Resp: 20  Temp: 98.1 F (36.7 C)    Constitutional: Alert and oriented. Well appearing and in no acute distress. Eyes: Conjunctivae are normal. PERRL. EOMI. Head: Atraumatic. Nose: No congestion/rhinnorhea. Mouth/Throat: Mucous membranes are moist.  Oropharynx non-erythematous. Neck: No  stridor. Painless ROM. No cervical spine tenderness to palpation Hematological/Lymphatic/Immunilogical: No cervical lymphadenopathy. Cardiovascular: Normal rate, regular rhythm. Grossly normal heart sounds.  Good peripheral circulation. Respiratory: Normal respiratory effort.  No retractions. Lungs CTAB. Gastrointestinal: Soft and nontender. Mild RUQ ttp. No peritonitis No distention. No abdominal bruits. No CVA tenderness. Genitourinary:  Musculoskeletal: No lower extremity tenderness nor edema.  No joint  effusions. Neurologic:  CN- intact.  No facial droop, Normal FNF.  Normal heel to shin.  Sensation intact bilaterally. Normal speech and language. No gross focal neurologic deficits are appreciated. No gait instability.  Skin:  Skin is warm, dry and intact. No rash noted. Psychiatric: Mood and affect are normal. Speech and behavior are normal.  ____________________________________________   LABS (all labs ordered are listed, but only abnormal results are displayed)  Results for orders placed or performed during the hospital encounter of 02/12/16 (from the past 24 hour(s))  Lipase, blood     Status: None   Collection Time: 02/12/16 10:57 AM  Result Value Ref Range   Lipase 23 11 - 51 U/L  Comprehensive metabolic panel     Status: Abnormal   Collection Time: 02/12/16 10:57 AM  Result Value Ref Range   Sodium 137 135 - 145 mmol/L   Potassium 4.2 3.5 - 5.1 mmol/L   Chloride 100 (L) 101 - 111 mmol/L   CO2 28 22 - 32 mmol/L   Glucose, Bld 92 65 - 99 mg/dL   BUN 15 6 - 20 mg/dL   Creatinine, Ser 1.87 (H) 0.61 - 1.24 mg/dL   Calcium 10.0 8.9 - 10.3 mg/dL   Total Protein 7.9 6.5 - 8.1 g/dL   Albumin 4.8 3.5 - 5.0 g/dL   AST 30 15 - 41 U/L   ALT 26 17 - 63 U/L   Alkaline Phosphatase 60 38 - 126 U/L   Total Bilirubin 1.0 0.3 - 1.2 mg/dL   GFR calc non Af Amer 40 (L) >60 mL/min   GFR calc Af Amer 46 (L) >60 mL/min   Anion gap 9 5 - 15  CBC     Status: None   Collection Time: 02/12/16 10:57 AM  Result Value Ref Range   WBC 9.1 3.8 - 10.6 K/uL   RBC 5.33 4.40 - 5.90 MIL/uL   Hemoglobin 16.8 13.0 - 18.0 g/dL   HCT 48.6 40.0 - 52.0 %   MCV 91.2 80.0 - 100.0 fL   MCH 31.4 26.0 - 34.0 pg   MCHC 34.5 32.0 - 36.0 g/dL   RDW 13.2 11.5 - 14.5 %   Platelets 222 150 - 440 K/uL  Urinalysis complete, with microscopic     Status: Abnormal   Collection Time: 02/12/16 10:57 AM  Result Value Ref Range   Color, Urine YELLOW (A) YELLOW   APPearance CLEAR (A) CLEAR   Glucose, UA NEGATIVE NEGATIVE  mg/dL   Bilirubin Urine NEGATIVE NEGATIVE   Ketones, ur NEGATIVE NEGATIVE mg/dL   Specific Gravity, Urine 1.027 1.005 - 1.030   Hgb urine dipstick NEGATIVE NEGATIVE   pH 7.0 5.0 - 8.0   Protein, ur 100 (A) NEGATIVE mg/dL   Nitrite NEGATIVE NEGATIVE   Leukocytes, UA NEGATIVE NEGATIVE   RBC / HPF 0-5 0 - 5 RBC/hpf   WBC, UA 0-5 0 - 5 WBC/hpf   Bacteria, UA NONE SEEN NONE SEEN   Squamous Epithelial / LPF 0-5 (A) NONE SEEN   Mucous PRESENT    ____________________________________________  EKG My review and personal interpretation at Time:  10:56   Indication: epigastric pain  Rate: 70  Rhythm: nsr Axis: normal Other: no acute ischemic changes, normal intervals ____________________________________________  RADIOLOGY  I personally reviewed all radiographic images ordered to evaluate for the above acute complaints and reviewed radiology reports and findings.  These findings were personally discussed with the patient.  Please see medical record for radiology report.  ____________________________________________   PROCEDURES  Procedure(s) performed: none    Critical Care performed: no ____________________________________________   INITIAL IMPRESSION / ASSESSMENT AND PLAN / ED COURSE  Pertinent labs & imaging results that were available during my care of the patient were reviewed by me and considered in my medical decision making (see chart for details).  DDX: pna, biliary colic, hepatitis, pancreatitis, perforated viscous,migfraine, sah, sdh, tension headache,  Nhia Heymann Langille is a 50 y.o. who presents to the ED with above complaints. Patient afebrile hemodynamically stable. Neuro exam is intact. No evidence of nuchal rigidity. Headache appears to be secondary complaint. Do not feel neuro imaging currently indicated at this time. His blood work is otherwise reassuring. Abdominal exam is also reassuring but given mild ttp in RUQ with complaints and age will order a right upper  quadrant ultrasound to evaluate for abnormality.  The patient will be placed on continuous pulse oximetry and telemetry for monitoring.  Laboratory evaluation will be sent to evaluate for the above complaints.     Clinical Course  Comment By Time  Headache has improved. Chest x-ray unremarkable. Abdominal pain also improved. Does still have some mild tender to palpation but no peritoneal signs and blood work is otherwise reassuring. Ultrasound results reviewed with patient. We'll touch base with surgery (further recommendations. Merlyn Lot, MD 10/26 1601  Spoke with Dr. Doreene Nest area of general surgery who agrees to follow-up patient in his clinic this coming week. Patient tolerated PO challenge.  Symptoms have improved. Remains hemodynamic stable. Do feel patient is appropriate for outpatient management. I discussed signs and symptoms for which the patient should return to the ER including fever, worsening pain, inability to tolerate oral hydration, jaundice.  Have discussed with the patient and available family all diagnostics and treatments performed thus far and all questions were answered to the best of my ability. The patient demonstrates understanding and agreement with plan.  Merlyn Lot, MD 10/26 1633     ____________________________________________   FINAL CLINICAL IMPRESSION(S) / ED DIAGNOSES  Final diagnoses:  RUQ abdominal pain  Polyp of gallbladder  Nonintractable headache, unspecified chronicity pattern, unspecified headache type      NEW MEDICATIONS STARTED DURING THIS VISIT:  New Prescriptions   No medications on file     Note:  This document was prepared using Dragon voice recognition software and may include unintentional dictation errors.    Merlyn Lot, MD 02/12/16 [phone]

## 2016-02-13 ENCOUNTER — Other Ambulatory Visit: Payer: Self-pay

## 2016-02-16 ENCOUNTER — Other Ambulatory Visit: Payer: Self-pay

## 2016-02-18 ENCOUNTER — Ambulatory Visit (INDEPENDENT_AMBULATORY_CARE_PROVIDER_SITE_OTHER): Payer: BLUE CROSS/BLUE SHIELD | Admitting: Surgery

## 2016-02-18 ENCOUNTER — Encounter: Payer: Self-pay | Admitting: Surgery

## 2016-02-18 VITALS — BP 142/89 | HR 66 | Temp 98.2°F | Ht 68.0 in | Wt 220.0 lb

## 2016-02-18 DIAGNOSIS — K2100 Gastro-esophageal reflux disease with esophagitis, without bleeding: Secondary | ICD-10-CM

## 2016-02-18 DIAGNOSIS — K824 Cholesterolosis of gallbladder: Secondary | ICD-10-CM

## 2016-02-18 MED ORDER — OMEPRAZOLE 20 MG PO CPDR: 20.0000 mg | DELAYED_RELEASE_CAPSULE | Freq: Every day | ORAL | Status: AC

## 2016-02-18 NOTE — Progress Notes (Signed)
02/18/2016  Reason for Visit:  Gallbladder polyps  History of Present Illness: Hunter Weber is a 50 y.o. male who recently presented to the emergency room on 10/26 with abdominal pain in the epigastric and right upper quadrant.  He denied having any fevers, chills, nausea, vomiting, other areas of abdominal pain, diarrhea, constipation, blood in the stool, or dysuria/hematuria.  He reports the pain was non-radiating.  The pain, though manageable, has remained in a low constant range.  He reports that in the mornings he feels like something is stuck in his throat and also a bitter taste in his mouth.  He denies any worsening of pain or nausea with po intake, and the times when the pain is worse are not associated with po intake or with nausea either.  He reports that sometimes if he presses on his RUQ under the rib, the pain gets better.  He does have a history of reflux/GERD but only takes protonix sporadically.    Of note, his wife does report noticing yellowish tone to his skin with dark urine earlier on which has disappeared now.  His LFTs in the ED were normal.  Past Medical History: Past Medical History:  Diagnosis Date  . Bell's palsy   . History of depression   . Hyperlipidemia   . Hypertension   . Low libido   . Other convulsions   . Positive ANA (antinuclear antibody)   . Seizure disorder ([place])   . Stroke [facility])      Past Surgical History: Past Surgical History:  Procedure Laterality Date  . CEREBRAL ANGIOGRAM    . MANDIBLE FRACTURE SURGERY      Home Medications: Prior to Admission medications   Medication Sig Start Date End Date Taking? Authorizing Provider  aspirin EC 81 MG tablet Take by mouth.   Yes Historical Provider, MD  dicyclomine (BENTYL) 10 MG capsule Take 1 capsule (10 mg total) by mouth 4 (four) times daily. 02/12/16 02/19/16 Yes Merlyn Lot, MD  fenofibrate 160 MG tablet Take 1 tablet (160 mg total) by mouth daily. 05/07/13  Yes Minna Merritts, MD   FLUoxetine (PROZAC) 20 MG capsule Take 20 mg by mouth.   Yes Historical Provider, MD  HYDROcodone-acetaminophen (NORCO) 5-325 MG tablet Take 1 tablet by mouth every 4 (four) hours as needed for moderate pain. 02/12/16  Yes Merlyn Lot, MD  levETIRAcetam (KEPPRA) 1000 MG tablet Take 1,000 mg by mouth.   Yes Historical Provider, MD  lisinopril (PRINIVIL,ZESTRIL) 10 MG tablet Take 10 mg by mouth.   Yes Historical Provider, MD  LORazepam (ATIVAN) 1 MG tablet TAKE 1 TABLET BY MOUTH AS NEEDED FOR POSSIBLE SEIZURE 02/28/15  Yes Historical Provider, MD  losartan (COZAAR) 100 MG tablet Take 100 mg by mouth daily.   Yes Historical Provider, MD  lovastatin (MEVACOR) 10 MG tablet Take 10 mg by mouth.   Yes Historical Provider, MD  pantoprazole (PROTONIX) 40 MG tablet  01/19/16  Yes Historical Provider, MD    Allergies: Allergies  Allergen Reactions  . Lisinopril   . Penicillins   . Shellfish Allergy     Social History:  reports that he has quit smoking. His smoking use included Cigarettes. He has a 40.00 pack-year smoking history. He has never used smokeless tobacco. He reports that he does not drink alcohol or use drugs.   Family History: Family History  Problem Relation Age of Onset  . Hypertension Mother   . Heart disease Father   . Heart attack Father   .  Heart attack Brother     Review of Systems: Review of Systems  Constitutional: Negative for chills and fever.  Eyes: Negative for blurred vision.  Respiratory: Negative for cough and shortness of breath.   Cardiovascular: Negative for chest pain and leg swelling.  Gastrointestinal: Positive for abdominal pain and heartburn. Negative for blood in stool, constipation, diarrhea, nausea and vomiting.  Genitourinary: Negative for dysuria and hematuria.  Musculoskeletal: Negative for myalgias.  Skin: Negative for rash.  Neurological: Negative for dizziness and headaches.  Psychiatric/Behavioral: Negative for depression.     Physical Exam BP (!) 142/89 (BP Location: Left Arm, Patient Position: Sitting)   Pulse 66   Temp 98.2 F (36.8 C) (Oral)   Ht 5\' 8"  (1.727 m)   Wt 99.8 kg (220 lb)   BMI 33.45 kg/m  CONSTITUTIONAL: No acute distress HEENT:  Normocephalic, atraumatic, extraocular motion intact. NECK: Trachea is midline, and there is no jugular venous distension.  LYMPH NODES:  Lymph nodes in the neck are not enlarged. RESPIRATORY:  Lungs are clear, and breath sounds are equal bilaterally. Normal respiratory effort without pathologic use of accessory muscles. CARDIOVASCULAR: Heart is regular without murmurs, gallops, or rubs. GI: The abdomen is soft, nondistended, obese, non-tender to palpation. There were no palpable masses. There was no hepatosplenomegaly. MUSCULOSKELETAL:  Normal muscle strength and tone in all four extremities.  No peripheral edema or cyanosis. SKIN: Skin turgor is normal. There are no pathologic skin lesions.  NEUROLOGIC:  Motor and sensation is grossly normal.  Cranial nerves are grossly intact. PSYCH:  Alert and oriented to person, place and time. Affect is normal.  Laboratory Analysis: LFTs were normal with a total bilirubin of 1.0, AST 30 ALT 26 and alkaline phosphatase of 60. White blood cell count was 9.1  Imaging: Ultrasound showed gallbladder polyps but no cold I will thickening or pericholecystic fluid  Assessment and Plan: This is a 51 y.o. male who presents with an episode of abdominal pain on 10/26.  The patient's symptoms not fully typical for gallbladder pathology. Rather given his reflux disease and not taking his PPI, and with his symptoms of something stuck in his throat with bitter taste in his mouth in the mornings this could potentially be more likely related to peptic ulcer versus reflux disease. With him having polyps in his gallbladder he would be less likely to have any problems with choledocholithiasis as suggested by his wife with the question of  jaundice that he had prior to this appointment. Of note his LFTs were normal in the emergency room.   I discussed with the patient that we should attempt to do a course of PPI to take daily for the next 2 weeks to 4 weeks. If there is no improvement in his symptoms that we could potentially consider doing a HIDA scan as an outpatient to further evaluate for gallbladder pathology. The patient reports not being able tolerate Protonix in the past so we will give her a prescription for Prilosec for one month. He will contact us in the future depending how his symptoms progress.  Patient understands this plan and all of his questions have been answered.   Melvyn Neth, [place]

## 2016-02-18 NOTE — Patient Instructions (Addendum)
We have prescribed you Omeprazole 20 mg once daily for 1 month. You will not need to make a follow up appointment with [place] if this works. However, please call us if you are having any discomfort or pain to make an appointment with Dr. Hampton Abbot.

## 2016-08-23 ENCOUNTER — Ambulatory Visit
Admission: RE | Admit: 2016-08-23 | Discharge: 2016-08-23 | Disposition: A | Discharge status: Home / Self Care | Payer: BLUE CROSS/BLUE SHIELD | Source: Ambulatory Visit | Attending: Nurse Practitioner | Admitting: Nurse Practitioner

## 2016-08-23 ENCOUNTER — Other Ambulatory Visit: Payer: Self-pay | Admitting: Nurse Practitioner

## 2016-08-23 DIAGNOSIS — Z8673 Personal history of transient ischemic attack (TIA), and cerebral infarction without residual deficits: Secondary | ICD-10-CM

## 2016-08-23 DIAGNOSIS — R51 Headache: Secondary | ICD-10-CM | POA: Diagnosis present

## 2016-08-23 DIAGNOSIS — I671 Cerebral aneurysm, nonruptured: Secondary | ICD-10-CM

## 2016-08-23 DIAGNOSIS — R519 Headache, unspecified: Secondary | ICD-10-CM

## 2016-08-24 ENCOUNTER — Other Ambulatory Visit: Payer: Self-pay | Admitting: Nurse Practitioner

## 2016-08-24 DIAGNOSIS — R519 Headache, unspecified: Secondary | ICD-10-CM

## 2016-08-24 DIAGNOSIS — I671 Cerebral aneurysm, nonruptured: Secondary | ICD-10-CM

## 2016-08-24 DIAGNOSIS — R51 Headache: Secondary | ICD-10-CM

## 2016-08-24 DIAGNOSIS — Z8673 Personal history of transient ischemic attack (TIA), and cerebral infarction without residual deficits: Secondary | ICD-10-CM

## 2016-09-06 ENCOUNTER — Ambulatory Visit
Admission: RE | Admit: 2016-09-06 | Discharge: 2016-09-06 | Disposition: A | Discharge status: Home / Self Care | Payer: BLUE CROSS/BLUE SHIELD | Source: Ambulatory Visit | Attending: Nurse Practitioner | Admitting: Nurse Practitioner

## 2016-09-06 DIAGNOSIS — R51 Headache: Secondary | ICD-10-CM | POA: Insufficient documentation

## 2016-09-06 DIAGNOSIS — R519 Headache, unspecified: Secondary | ICD-10-CM

## 2016-09-06 DIAGNOSIS — Z8673 Personal history of transient ischemic attack (TIA), and cerebral infarction without residual deficits: Secondary | ICD-10-CM | POA: Diagnosis present

## 2016-09-06 DIAGNOSIS — I671 Cerebral aneurysm, nonruptured: Secondary | ICD-10-CM | POA: Insufficient documentation

## 2016-11-19 IMAGING — US US CAROTID DUPLEX BILAT
1 series · 13 of 24 positions shown · non-contrast
Comparison: 08/15/2014

CLINICAL DATA: Amaurosis fugax, right eye. Hypertension, previous
stroke, hyperlipidemia.

EXAM:
BILATERAL CAROTID DUPLEX ULTRASOUND
TECHNIQUE: Gray scale imaging, color Doppler and duplex ultrasound was
performed of bilateral carotid and vertebral arteries in the neck.

[Series 1: us carotid duplex bilat · 0.05mm/px · 13 of 64 slices shown]
[im 1/64]
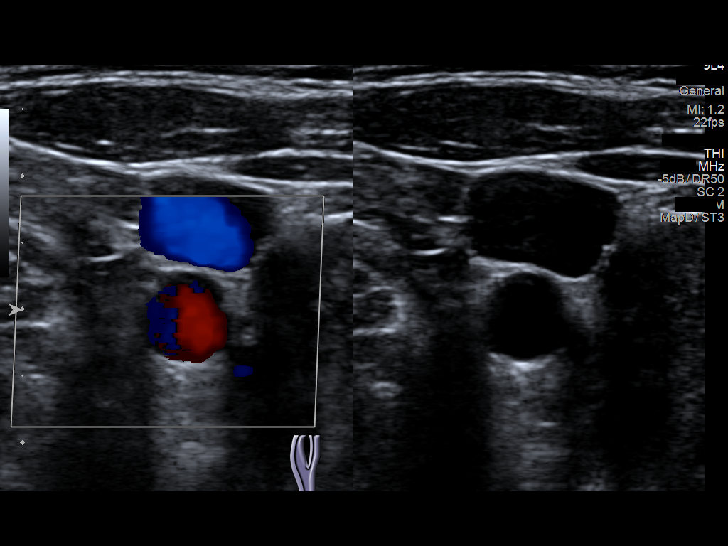
[im 6/64]
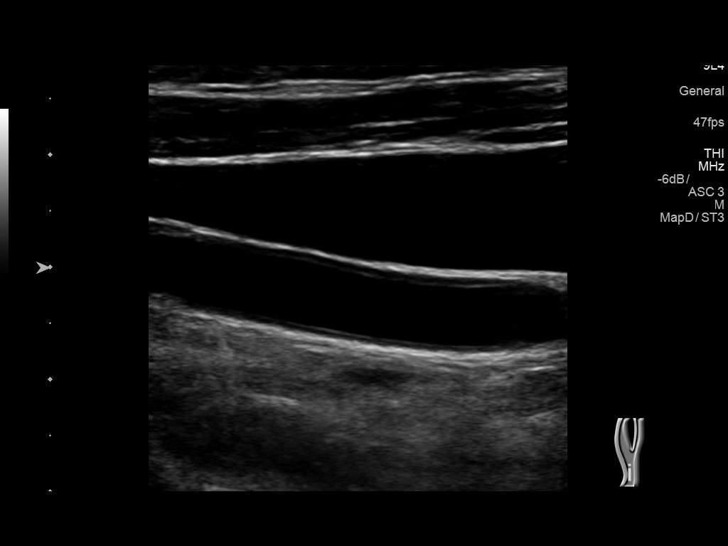
[im 11/64]
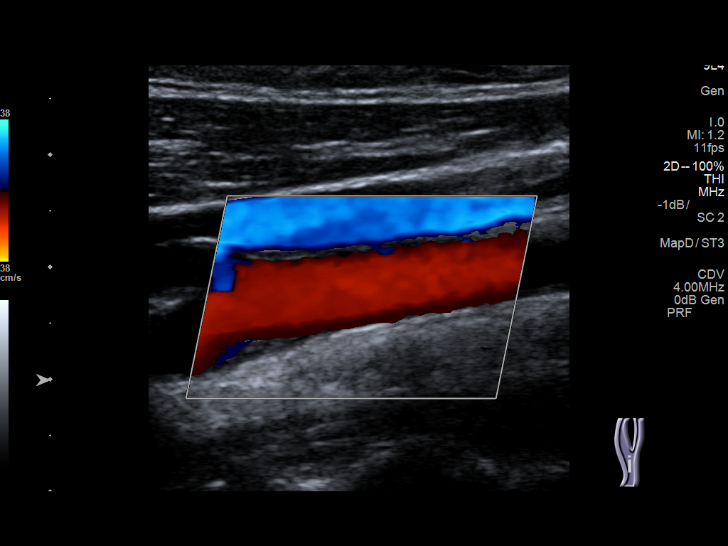
[im 17/64]
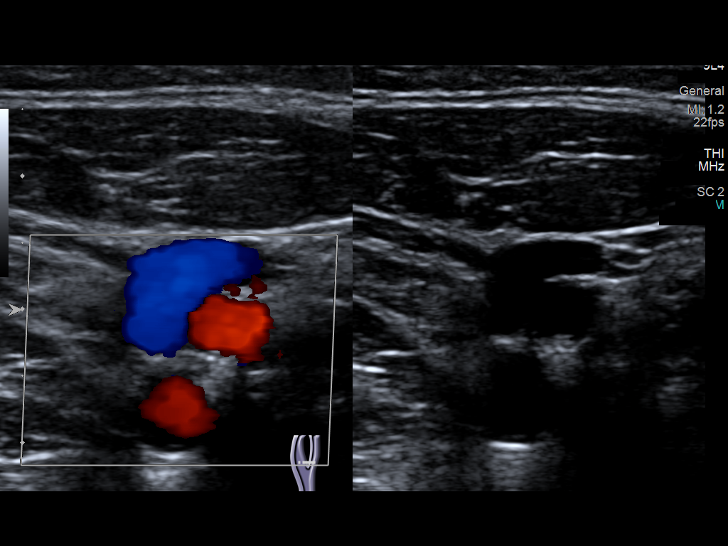
[im 22/64]
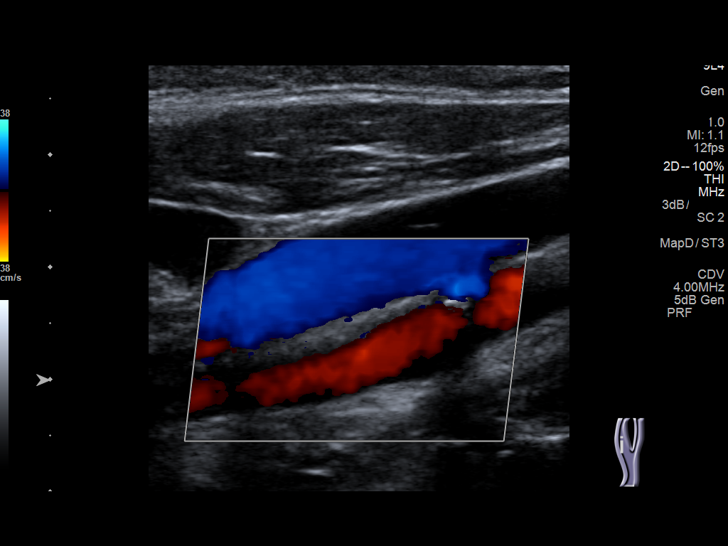
[im 28/64]
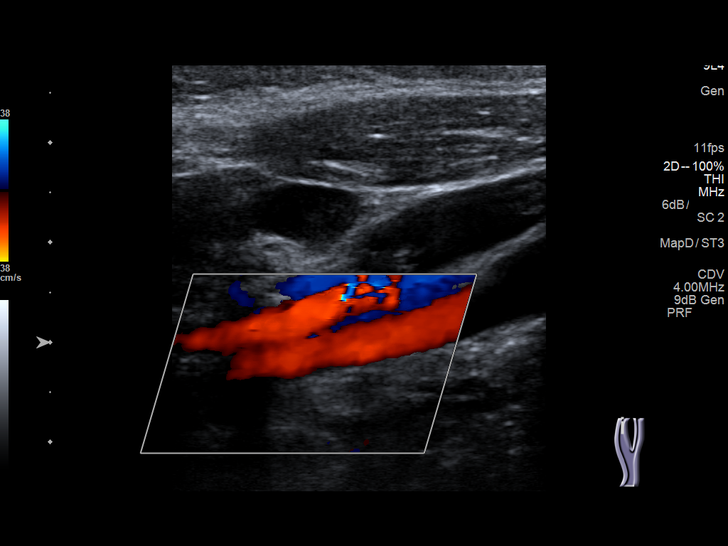
[im 33/64]
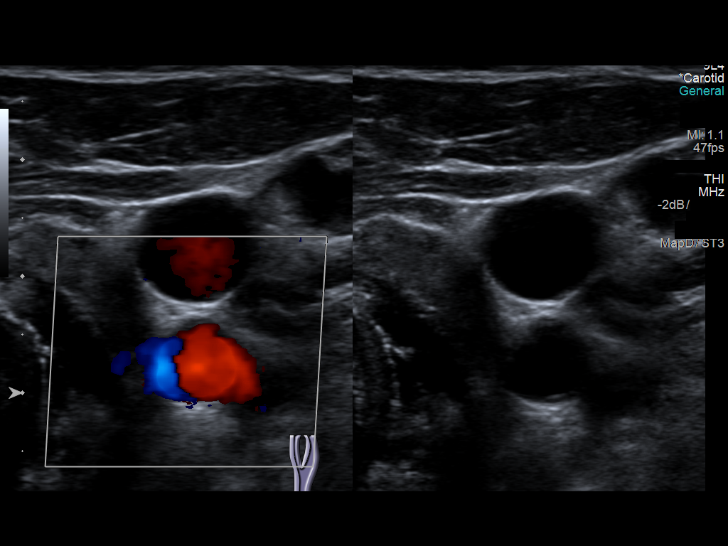
[im 36/64]
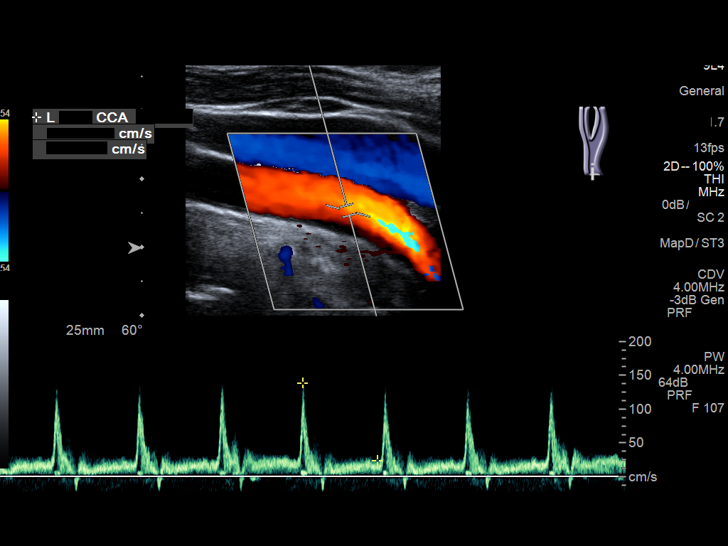
[im 42/64]
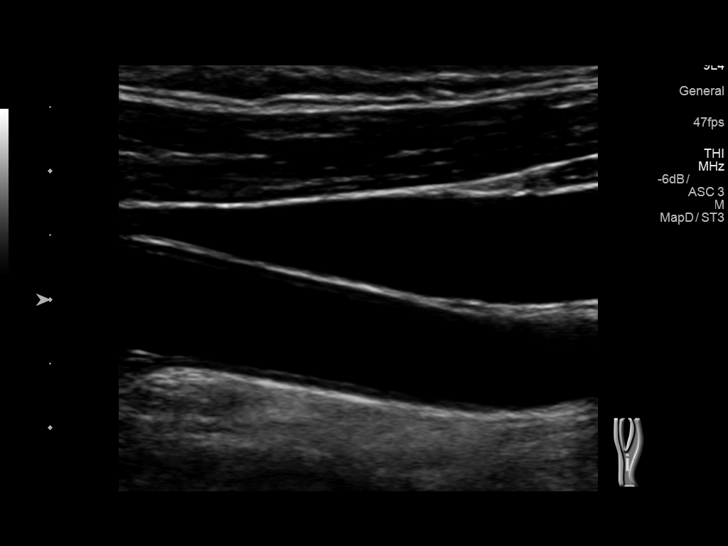
[im 47/64]
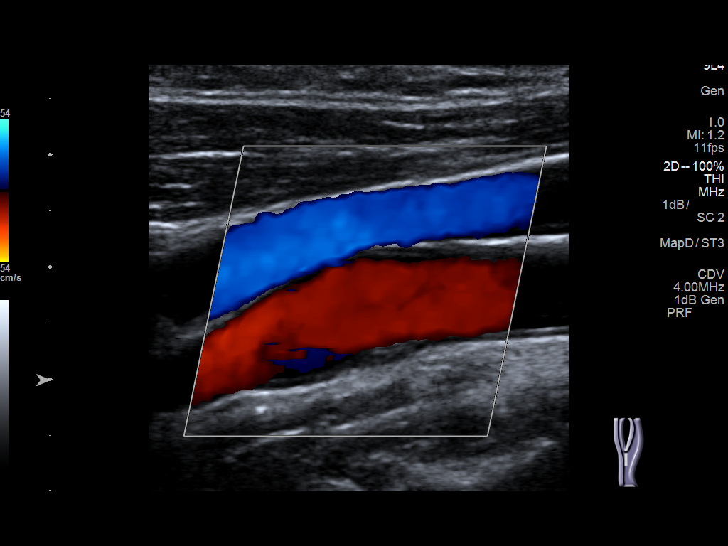
[im 53/64]
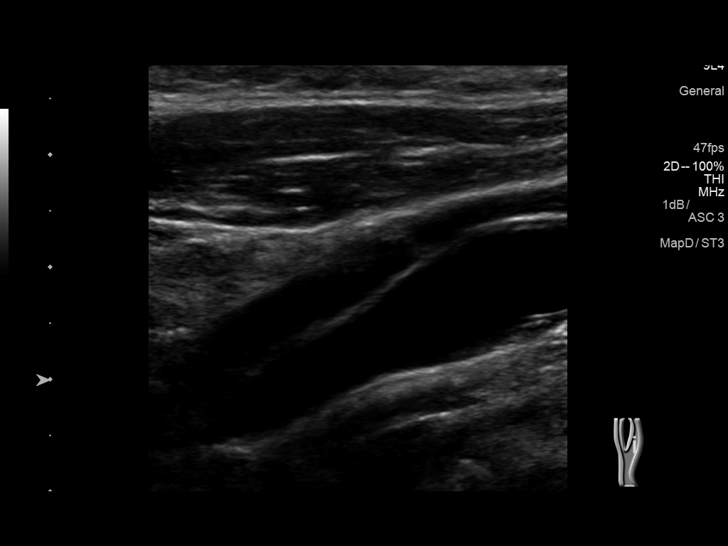
[im 58/64]
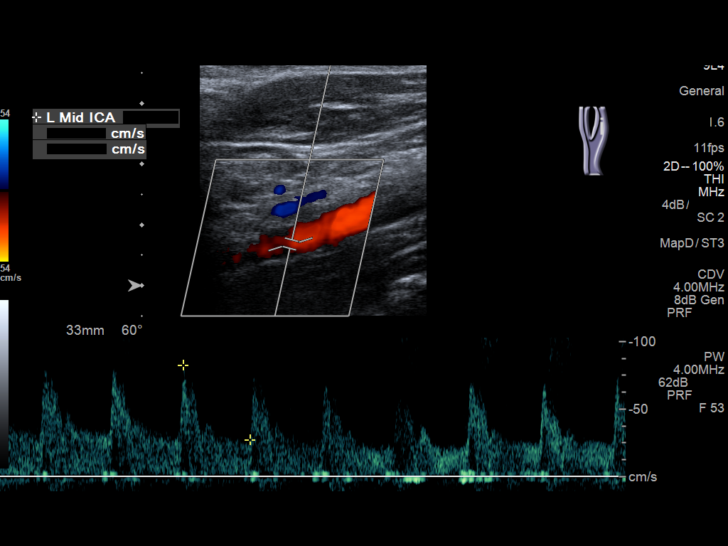
[im 64/64]
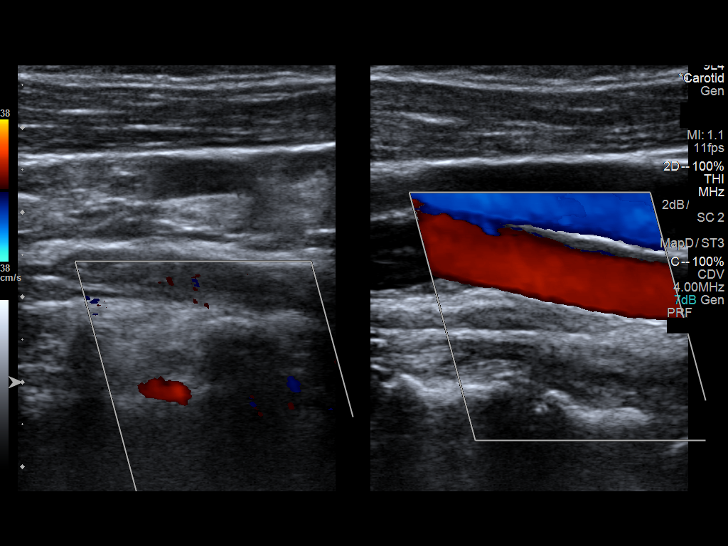

[13 of 24 positions shown; findings below may reference images not displayed]

REVIEW OF SYSTEMS:
Quantification of carotid stenosis is based on velocity parameters
that correlate the residual internal carotid diameter with
NASCET-based stenosis levels, using the diameter of the distal
internal carotid lumen as the denominator for stenosis measurement.

The following velocity measurements were obtained:

PEAK SYSTOLIC/END DIASTOLIC

RIGHT

ICA:                     65/18cm/sec

CCA:                     104/23cm/sec

SYSTOLIC ICA/CCA RATIO:

DIASTOLIC ICA/CCA RATIO:

ECA:                     121cm/sec

LEFT

ICA:                     92/33cm/sec

CCA:                     114/22cm/sec

SYSTOLIC ICA/CCA RATIO:

DIASTOLIC ICA/CCA RATIO:

ECA:                     147cm/sec
FINDINGS: RIGHT CAROTID ARTERY: Eccentric smooth noncalcified plaque in the
carotid bulb extending into the proximal ICA. No high-grade
stenosis. Normal waveforms and color Doppler signal.

RIGHT VERTEBRAL ARTERY:  Normal flow direction and waveform.

LEFT CAROTID ARTERY: Eccentric smooth noncalcified plaque in the
carotid bulb. No high-grade stenosis. Normal waveforms and color
Doppler signal.

LEFT VERTEBRAL ARTERY: Normal flow direction and waveform.
IMPRESSION: 1. Early bilateral carotid bifurcation plaque resulting in less than
50% diameter stenosis. The exam does not exclude plaque ulceration
or embolization. Continued surveillance recommended.

## 2017-01-29 ENCOUNTER — Encounter: Payer: Self-pay | Admitting: Emergency Medicine

## 2017-01-29 ENCOUNTER — Emergency Department: Payer: Medicare Other

## 2017-01-29 ENCOUNTER — Emergency Department
Admission: EM | Admit: 2017-01-29 | Discharge: 2017-01-29 | Disposition: A | Discharge status: Home / Self Care | Payer: Medicare Other | Attending: Emergency Medicine | Admitting: Emergency Medicine

## 2017-01-29 DIAGNOSIS — R51 Headache: Secondary | ICD-10-CM | POA: Insufficient documentation

## 2017-01-29 DIAGNOSIS — Z7982 Long term (current) use of aspirin: Secondary | ICD-10-CM | POA: Insufficient documentation

## 2017-01-29 DIAGNOSIS — I129 Hypertensive chronic kidney disease with stage 1 through stage 4 chronic kidney disease, or unspecified chronic kidney disease: Secondary | ICD-10-CM | POA: Diagnosis not present

## 2017-01-29 DIAGNOSIS — N183 Chronic kidney disease, stage 3 (moderate): Secondary | ICD-10-CM | POA: Insufficient documentation

## 2017-01-29 DIAGNOSIS — Z79899 Other long term (current) drug therapy: Secondary | ICD-10-CM | POA: Diagnosis not present

## 2017-01-29 DIAGNOSIS — H539 Unspecified visual disturbance: Secondary | ICD-10-CM

## 2017-01-29 DIAGNOSIS — R519 Headache, unspecified: Secondary | ICD-10-CM

## 2017-01-29 DIAGNOSIS — Z87891 Personal history of nicotine dependence: Secondary | ICD-10-CM | POA: Diagnosis not present

## 2017-01-29 LAB — CBC WITH DIFFERENTIAL/PLATELET
BASOS PCT: 2 %
Basophils Absolute: 0.1 10*3/uL (ref 0–0.1)
Eosinophils Absolute: 0.1 10*3/uL (ref 0–0.7)
Eosinophils Relative: 1 %
HEMATOCRIT: 43.6 % (ref 40.0–52.0)
HEMOGLOBIN: 15.5 g/dL (ref 13.0–18.0)
LYMPHS ABS: 1.9 10*3/uL (ref 1.0–3.6)
LYMPHS PCT: 28 %
MCH: 31.8 pg (ref 26.0–34.0)
MCHC: 35.5 g/dL (ref 32.0–36.0)
MCV: 89.5 fL (ref 80.0–100.0)
MONO ABS: 0.6 10*3/uL (ref 0.2–1.0)
MONOS PCT: 8 %
NEUTROS ABS: 4.2 10*3/uL (ref 1.4–6.5)
Neutrophils Relative %: 61 %
PLATELETS: 207 10*3/uL (ref 150–440)
RBC: 4.87 MIL/uL (ref 4.40–5.90)
RDW: 13.2 % (ref 11.5–14.5)
WBC: 6.9 10*3/uL (ref 3.8–10.6)

## 2017-01-29 LAB — BASIC METABOLIC PANEL
ANION GAP: 8 (ref 5–15)
BUN: 16 mg/dL (ref 6–20)
CO2: 26 mmol/L (ref 22–32)
Calcium: 9.6 mg/dL (ref 8.9–10.3)
Chloride: 102 mmol/L (ref 101–111)
Creatinine, Ser: 1.65 mg/dL — ABNORMAL HIGH (ref 0.61–1.24)
GFR, EST AFRICAN AMERICAN: 53 mL/min — AB (ref >60)
GFR, EST NON AFRICAN AMERICAN: 46 mL/min — AB (ref >60)
Glucose, Bld: 87 mg/dL (ref 65–99)
POTASSIUM: 3.9 mmol/L (ref 3.5–5.1)
SODIUM: 136 mmol/L (ref 135–145)

## 2017-01-29 MED ORDER — PROCHLORPERAZINE EDISYLATE 5 MG/ML IJ SOLN
10.0000 mg | Freq: Once | INTRAMUSCULAR | Status: AC
  Administered 2017-01-29: 10 mg via INTRAVENOUS
  Filled 2017-01-29: qty 2

## 2017-01-29 MED ORDER — SODIUM CHLORIDE 0.9 % IV BOLUS (SEPSIS)
1000.0000 mL | Freq: Once | INTRAVENOUS | Status: AC
  Administered 2017-01-29: 1000 mL via INTRAVENOUS

## 2017-01-29 NOTE — ED Triage Notes (Signed)
States has had visual changes today, first noted about 8am. Also headache about 7/10. States history of aneurysm and strokes. L grip weaker than L but states this is from previous strokes.

## 2017-01-29 NOTE — ED Notes (Signed)
Pt spoke with mri tech

## 2017-01-29 NOTE — ED Notes (Signed)
Pt in mri 

## 2017-01-29 NOTE — ED Provider Notes (Signed)
[facility] Emergency Department Provider Note   ____________________________________________   I have reviewed the triage vital signs and the nursing notes.   HISTORY  Chief Complaint Visual Field Change   History limited by: Not Limited   HPI Hunter Weber is a 51 y.o. male who presents to the emergency department today because of concern for vision change.   LOCATION:right eye DURATION:first noticed upon awakening this morning TIMING: constant  QUALITY: feels like a kaleidescope of colors CONTEXT: patient with history of CVA and aneurysm. Has chronic left sided weakness and decreased sensation. Denies any trauma. Denies any new medications. MODIFYING FACTORS: none identified ASSOCIATED SYMPTOMS: headache that started yesterday  Per medical record review patient has a history of CVA, aneurysm. Seizure.   Past Medical History:  Diagnosis Date  . Bell's palsy   . History of depression   . Hyperlipidemia   . Hypertension   . Low libido   . Other convulsions   . Positive ANA (antinuclear antibody)   . Seizure disorder ([place])   . Stroke [facility])     Patient Active Problem List   Diagnosis Date Noted  . Acute ischemic right middle cerebral artery (MCA) stroke ([place]) 08/29/2014  . Aneurysm of middle cerebral artery 08/29/2014  . CKD (chronic kidney disease) stage 3, GFR 30-59 ml/min (HCC) 05/22/2014  . 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 Depression 12/31/2013  . GERD (gastroesophageal reflux disease) 12/31/2013  . Chest pain 05/05/2012  . Hyperlipidemia 05/05/2012  . Essential hypertension 05/05/2012  . Smoking history 05/05/2012    Past Surgical History:  Procedure Laterality Date  . CEREBRAL ANGIOGRAM    . MANDIBLE FRACTURE SURGERY      Prior to Admission medications   Medication Sig Start Date End Date Taking? Authorizing Provider  aspirin EC 81 MG tablet Take by mouth.    [provider]   dicyclomine (BENTYL) 10 MG capsule Take 1 capsule (10 mg total) by mouth 4 (four) times daily. 02/12/16 02/19/16  Merlyn Lot, MD  fenofibrate 160 MG tablet Take 1 tablet (160 mg total) by mouth daily. 05/07/13   Minna Merritts, MD  FLUoxetine (PROZAC) 20 MG capsule Take 20 mg by mouth.    [provider]  HYDROcodone-acetaminophen (NORCO) 5-325 MG tablet Take 1 tablet by mouth every 4 (four) hours as needed for moderate pain. 02/12/16   Merlyn Lot, MD  levETIRAcetam (KEPPRA) 1000 MG tablet Take 1,000 mg by mouth.    [provider]  lisinopril (PRINIVIL,ZESTRIL) 10 MG tablet Take 10 mg by mouth.    [provider]  LORazepam (ATIVAN) 1 MG tablet TAKE 1 TABLET BY MOUTH AS NEEDED FOR POSSIBLE SEIZURE 02/28/15   [provider]  losartan (COZAAR) 100 MG tablet Take 100 mg by mouth daily.    [provider]  lovastatin (MEVACOR) 10 MG tablet Take 10 mg by mouth.    [provider]  pantoprazole (PROTONIX) 40 MG tablet  01/19/16   [provider]    Allergies Lisinopril; Penicillins; and Shellfish allergy  Family History  Problem Relation Age of Onset  . Hypertension Mother   . Heart disease Father   . Heart attack Father   . Heart attack Brother     Social History Social History  Substance Use Topics  . Smoking status: Former Smoker    Packs/day: 2.00    Years: 20.00    Types: Cigarettes  . Smokeless tobacco: Never Used  .  Alcohol use No    Review of Systems Constitutional: No fever/chills Eyes: Positive for right eye pain. ENT: No sore throat. Cardiovascular: Denies chest pain. Respiratory: Denies shortness of breath. Gastrointestinal: No abdominal pain.  No nausea, no vomiting.  No diarrhea.   Genitourinary: Negative for dysuria. Musculoskeletal: Negative for back pain. Skin: Negative for rash. Neurological: Positive for headache.   ____________________________________________   PHYSICAL  EXAM:  VITAL SIGNS: ED Triage Vitals  Enc Vitals Group     BP 01/29/17 1453 122/75     Pulse Rate 01/29/17 1453 70     Resp 01/29/17 1453 18     Temp 01/29/17 1453 97.8 F (36.6 C)     Temp Source 01/29/17 1453 Oral     SpO2 01/29/17 1453 100 %     Weight 01/29/17 1454 230 lb (104.3 kg)     Height 01/29/17 1454 5\' 8"  (1.727 m)     Head Circumference --      Peak Flow --      Pain Score 01/29/17 1453 7    Constitutional: Alert and oriented. Well appearing and in no distress. Eyes: Conjunctivae are normal.  ENT   Head: Normocephalic and atraumatic.   Nose: No congestion/rhinnorhea.   Mouth/Throat: Mucous membranes are moist.   Neck: No stridor. Hematological/Lymphatic/Immunilogical: No cervical lymphadenopathy. Cardiovascular: Normal rate, regular rhythm.  No murmurs, rubs, or gallops.  Respiratory: Normal respiratory effort without tachypnea nor retractions. Breath sounds are clear and equal bilaterally. No wheezes/rales/rhonchi. Gastrointestinal: Soft and non tender. No rebound. No guarding.  Genitourinary: Deferred Musculoskeletal: Normal range of motion in all extremities. No lower extremity edema. Neurologic:  Normal speech and language. Slight left sided upper and lower body weakness. Decreased sensation over left hand.  Skin:  Skin is warm, dry and intact. No rash noted. Psychiatric: Mood and affect are normal. Speech and behavior are normal. Patient exhibits appropriate insight and judgment.  ____________________________________________    LABS (pertinent positives/negatives)  CBC wnl BMP cr 1.65  ____________________________________________   EKG  None  ____________________________________________    RADIOLOGY  MR brain No acute abnormality  ____________________________________________   PROCEDURES  Procedures  ____________________________________________   INITIAL IMPRESSION / ASSESSMENT AND PLAN / ED COURSE  Pertinent labs &  imaging results that were available during my care of the patient were reviewed by me and considered in my medical decision making (see chart for details).  Patient presented to the emergency department today because of concerns for visual disturbance of the right eye. Differential would include stroke, retinal detachment, migraine with visual disturbance, apparent electrical activity amongst other etiologies. MR of the brain did not show any acute stroke. Patient was given medication for headache. He did state he felt improvement in Weber headache and visual disturbance. Discussed with patient results of testing and importance of follow up with his neurologist.    ____________________________________________   FINAL CLINICAL IMPRESSION(S) / ED DIAGNOSES  Final diagnoses:  Visual disturbance  Nonintractable headache, unspecified chronicity pattern, unspecified headache type     Note: This dictation was prepared with Dragon dictation. Any transcriptional errors that result from this process are unintentional     Nance Pear, MD 01/29/17 [phone]

## 2017-01-29 NOTE — ED Notes (Signed)
Pt states feels better, that the medication helped clear his vision. vss

## 2017-01-29 NOTE — Discharge Instructions (Signed)
Please seek medical attention for any high fevers, chest pain, shortness of breath, change in behavior, persistent vomiting, bloody stool or any other new or concerning symptoms.  

## 2017-01-29 NOTE — ED Notes (Signed)
Pt in room, back from [facility]

## 2017-03-14 DIAGNOSIS — I10 Essential (primary) hypertension: Secondary | ICD-10-CM | POA: Diagnosis not present

## 2017-03-14 DIAGNOSIS — I69354 Hemiplegia and hemiparesis following cerebral infarction affecting left non-dominant side: Secondary | ICD-10-CM | POA: Diagnosis not present

## 2017-03-14 DIAGNOSIS — K219 Gastro-esophageal reflux disease without esophagitis: Secondary | ICD-10-CM | POA: Diagnosis not present

## 2017-03-14 DIAGNOSIS — G40219 Localization-related (focal) (partial) symptomatic epilepsy and epileptic syndromes with complex partial seizures, intractable, without status epilepticus: Secondary | ICD-10-CM | POA: Diagnosis not present

## 2017-03-14 DIAGNOSIS — R69 Illness, unspecified: Secondary | ICD-10-CM | POA: Diagnosis not present

## 2017-03-14 DIAGNOSIS — E7800 Pure hypercholesterolemia, unspecified: Secondary | ICD-10-CM | POA: Diagnosis not present

## 2017-03-17 DIAGNOSIS — E7800 Pure hypercholesterolemia, unspecified: Secondary | ICD-10-CM | POA: Diagnosis not present

## 2017-05-11 DIAGNOSIS — Z8673 Personal history of transient ischemic attack (TIA), and cerebral infarction without residual deficits: Secondary | ICD-10-CM | POA: Diagnosis not present

## 2017-05-11 DIAGNOSIS — I69354 Hemiplegia and hemiparesis following cerebral infarction affecting left non-dominant side: Secondary | ICD-10-CM | POA: Diagnosis not present

## 2017-05-11 DIAGNOSIS — G40219 Localization-related (focal) (partial) symptomatic epilepsy and epileptic syndromes with complex partial seizures, intractable, without status epilepticus: Secondary | ICD-10-CM | POA: Diagnosis not present

## 2017-05-11 DIAGNOSIS — G43119 Migraine with aura, intractable, without status migrainosus: Secondary | ICD-10-CM | POA: Diagnosis not present

## 2017-05-11 DIAGNOSIS — I671 Cerebral aneurysm, nonruptured: Secondary | ICD-10-CM | POA: Diagnosis not present

## 2017-12-04 ENCOUNTER — Emergency Department: Payer: Medicare HMO

## 2017-12-04 ENCOUNTER — Emergency Department
Admission: EM | Admit: 2017-12-04 | Discharge: 2017-12-04 | Disposition: A | Discharge status: Home / Self Care | Payer: Medicare HMO | Attending: Emergency Medicine | Admitting: Emergency Medicine

## 2017-12-04 ENCOUNTER — Other Ambulatory Visit: Payer: Self-pay

## 2017-12-04 DIAGNOSIS — Z7982 Long term (current) use of aspirin: Secondary | ICD-10-CM | POA: Insufficient documentation

## 2017-12-04 DIAGNOSIS — Z8673 Personal history of transient ischemic attack (TIA), and cerebral infarction without residual deficits: Secondary | ICD-10-CM | POA: Insufficient documentation

## 2017-12-04 DIAGNOSIS — Z79899 Other long term (current) drug therapy: Secondary | ICD-10-CM | POA: Diagnosis not present

## 2017-12-04 DIAGNOSIS — I129 Hypertensive chronic kidney disease with stage 1 through stage 4 chronic kidney disease, or unspecified chronic kidney disease: Secondary | ICD-10-CM | POA: Diagnosis not present

## 2017-12-04 DIAGNOSIS — R51 Headache: Secondary | ICD-10-CM | POA: Diagnosis not present

## 2017-12-04 DIAGNOSIS — Z87891 Personal history of nicotine dependence: Secondary | ICD-10-CM | POA: Diagnosis not present

## 2017-12-04 DIAGNOSIS — N183 Chronic kidney disease, stage 3 (moderate): Secondary | ICD-10-CM | POA: Diagnosis not present

## 2017-12-04 DIAGNOSIS — R519 Headache, unspecified: Secondary | ICD-10-CM

## 2017-12-04 HISTORY — DX: Epilepsy, unspecified, not intractable, without status epilepticus: G40.909

## 2017-12-04 HISTORY — DX: Gastro-esophageal reflux disease without esophagitis: K21.9

## 2017-12-04 HISTORY — DX: Migraine, unspecified, not intractable, without status migrainosus: G43.909

## 2017-12-04 LAB — COMPREHENSIVE METABOLIC PANEL
ALBUMIN: 4.3 g/dL (ref 3.5–5.0)
ALT: 27 U/L (ref 0–44)
AST: 28 U/L (ref 15–41)
Alkaline Phosphatase: 70 U/L (ref 38–126)
Anion gap: 9 (ref 5–15)
BUN: 17 mg/dL (ref 6–20)
CHLORIDE: 100 mmol/L (ref 98–111)
CO2: 26 mmol/L (ref 22–32)
CREATININE: 1.48 mg/dL — AB (ref 0.61–1.24)
Calcium: 9.1 mg/dL (ref 8.9–10.3)
GFR calc Af Amer: >60 mL/min (ref >60)
GFR calc non Af Amer: 52 mL/min — ABNORMAL LOW (ref >60)
GLUCOSE: 115 mg/dL — AB (ref 70–99)
Potassium: 3.7 mmol/L (ref 3.5–5.1)
Sodium: 135 mmol/L (ref 135–145)
Total Bilirubin: 1.2 mg/dL (ref 0.3–1.2)
Total Protein: 7 g/dL (ref 6.5–8.1)

## 2017-12-04 LAB — CBC WITH DIFFERENTIAL/PLATELET
Basophils Absolute: 0.1 10*3/uL (ref 0–0.1)
Basophils Relative: 1 %
EOS PCT: 1 %
Eosinophils Absolute: 0.1 10*3/uL (ref 0–0.7)
HCT: 42.8 % (ref 40.0–52.0)
Hemoglobin: 15.6 g/dL (ref 13.0–18.0)
LYMPHS ABS: 2.1 10*3/uL (ref 1.0–3.6)
LYMPHS PCT: 27 %
MCH: 32.6 pg (ref 26.0–34.0)
MCHC: 36.4 g/dL — AB (ref 32.0–36.0)
MCV: 89.5 fL (ref 80.0–100.0)
MONO ABS: 0.7 10*3/uL (ref 0.2–1.0)
MONOS PCT: 8 %
Neutro Abs: 5 10*3/uL (ref 1.4–6.5)
Neutrophils Relative %: 63 %
Platelets: 207 10*3/uL (ref 150–440)
RBC: 4.78 MIL/uL (ref 4.40–5.90)
RDW: 12.8 % (ref 11.5–14.5)
WBC: 7.9 10*3/uL (ref 3.8–10.6)

## 2017-12-04 MED ORDER — SODIUM CHLORIDE 0.9 % IV BOLUS
1000.0000 mL | Freq: Once | INTRAVENOUS | Status: AC
  Administered 2017-12-04: 1000 mL via INTRAVENOUS

## 2017-12-04 MED ORDER — KETOROLAC TROMETHAMINE 30 MG/ML IJ SOLN
30.0000 mg | Freq: Once | INTRAMUSCULAR | Status: AC
  Administered 2017-12-04: 30 mg via INTRAVENOUS
  Filled 2017-12-04: qty 1

## 2017-12-04 MED ORDER — MAGNESIUM SULFATE 2 GM/50ML IV SOLN
2.0000 g | Freq: Once | INTRAVENOUS | Status: AC
  Administered 2017-12-04: 2 g via INTRAVENOUS
  Filled 2017-12-04: qty 50

## 2017-12-04 NOTE — ED Notes (Signed)
Pt states his headache does not feel like his normal migraines, feels more like a sinus infection but has no other symptoms of sinuses.

## 2017-12-04 NOTE — ED Provider Notes (Signed)
[facility] Emergency Department Provider Note ____________________________________________   First MD Initiated Contact with Patient 12/04/17 1625     (approximate)  I have reviewed the triage vital signs and the nursing notes.   HISTORY  Chief Complaint Headache   HPI Hunter Weber is a 52 y.o. male with a history of CVA, migraines as well as aneurysm without intervention because of small size of the aneurysm who was presented to the emergency department today with a headache x1 week.  He says the headache is a 5 and frontal and feels like a pressure.  Says the headache started out slight and has slowly increased over the past week.  Is tried Fioricet as well as ibuprofen at home without relief.  Denies any weakness, numbness or visual disturbance.   Past Medical History:  Diagnosis Date  . Bell's palsy   . Epilepsy ([place])   . GERD (gastroesophageal reflux disease)   . History of depression   . Hyperlipidemia   . Hypertension   . Low libido   . Migraines   . Other convulsions   . Positive ANA (antinuclear antibody)   . Seizure disorder ([place])   . Stroke [facility])     Patient Active Problem List   Diagnosis Date Noted  . Acute ischemic right middle cerebral artery (MCA) stroke ([place]) 08/29/2014  . Aneurysm of middle cerebral artery 08/29/2014  . CKD (chronic kidney disease) stage 3, GFR 30-59 ml/min (HCC) 05/22/2014  . 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 Depression 12/31/2013  . GERD (gastroesophageal reflux disease) 12/31/2013  . Chest pain 05/05/2012  . Hyperlipidemia 05/05/2012  . Essential hypertension 05/05/2012  . Smoking history 05/05/2012    Past Surgical History:  Procedure Laterality Date  . CEREBRAL ANGIOGRAM    . MANDIBLE FRACTURE SURGERY      Prior to Admission medications   Medication Sig Start Date End Date Taking? Authorizing Provider  aspirin EC 81 MG tablet Take by mouth.    [provider]  dicyclomine (BENTYL) 10 MG capsule Take 1 capsule (10 mg total) by mouth 4 (four) times daily. 02/12/16 02/19/16  Merlyn Lot, MD  fenofibrate 160 MG tablet Take 1 tablet (160 mg total) by mouth daily. 05/07/13   Minna Merritts, MD  FLUoxetine (PROZAC) 20 MG capsule Take 20 mg by mouth.    [provider]  HYDROcodone-acetaminophen (NORCO) 5-325 MG tablet Take 1 tablet by mouth every 4 (four) hours as needed for moderate pain. 02/12/16   Merlyn Lot, MD  levETIRAcetam (KEPPRA) 1000 MG tablet Take 1,000 mg by mouth.    [provider]  lisinopril (PRINIVIL,ZESTRIL) 10 MG tablet Take 10 mg by mouth.    [provider]  LORazepam (ATIVAN) 1 MG tablet TAKE 1 TABLET BY MOUTH AS NEEDED FOR POSSIBLE SEIZURE 02/28/15   [provider]  losartan (COZAAR) 100 MG tablet Take 100 mg by mouth daily.    [provider]  lovastatin (MEVACOR) 10 MG tablet Take 10 mg by mouth.    [provider]  pantoprazole (PROTONIX) 40 MG tablet  01/19/16   [provider]    Allergies Diphenhydramine; Lisinopril; Penicillins; and Shellfish allergy  Family History  Problem Relation Age of Onset  . Hypertension Mother   . Heart disease Father   . Heart attack Father   . Heart attack Brother     Social History Social History   Tobacco Use  . Smoking status: Former  Smoker    Packs/day: 2.00    Years: 20.00    Pack years: 40.00    Types: Cigarettes  . Smokeless tobacco: Never Used  Substance Use Topics  . Alcohol use: No  . Drug use: No    Review of Systems  Constitutional: No fever/chills Eyes: No visual changes. ENT: No sore throat. Cardiovascular: Denies chest pain. Respiratory: Denies shortness of breath. Gastrointestinal: No abdominal pain.  No nausea, no vomiting.  No diarrhea.  No constipation. Genitourinary: Negative for dysuria. Musculoskeletal: Negative for back pain. Skin: Negative for  rash. Neurological: Baseline left-sided weakness and numbness.   ____________________________________________   PHYSICAL EXAM:  VITAL SIGNS: ED Triage Vitals [12/04/17 1606]  Enc Vitals Group     BP 118/66     Pulse Rate 74     Resp 18     Temp 98.5 F (36.9 C)     Temp Source Oral     SpO2 95 %     Weight 230 lb (104.3 kg)     Height 5\' 8"  (1.727 m)     Head Circumference      Peak Flow      Pain Score 4     Pain Loc      Pain Edu?      Excl. in [place]?     Constitutional: Alert and oriented. Well appearing and in no acute distress. Eyes: Conjunctivae are normal.  Head: Atraumatic. Nose: No congestion/rhinnorhea. Mouth/Throat: Mucous membranes are moist.  Neck: No stridor.   Cardiovascular: Normal rate, regular rhythm. Grossly normal heart sounds.   Respiratory: Normal respiratory effort.  No retractions. Lungs CTAB. Gastrointestinal: Soft and nontender. No distention.  Musculoskeletal: No lower extremity tenderness nor edema.  No joint effusions. Neurologic:  Normal speech and language.  Baseline decreased sensation to light touch to the left fingers.  4-5 strength to the left index and thumb which is baseline for the patient.  Strong 4-5 to the left lower extremity which is the patient's baseline as well.  Patient says that these left-sided deficits have improved over time. Skin:  Skin is warm, dry and intact. No rash noted. Psychiatric: Mood and affect are normal. Speech and behavior are normal.  ____________________________________________   LABS (all labs ordered are listed, but only abnormal results are displayed)  Labs Reviewed  COMPREHENSIVE METABOLIC PANEL - Abnormal; Notable for the following components:      Result Value   Glucose, Bld 115 (*)    Creatinine, Ser 1.48 (*)    GFR calc non Af Amer 52 (*)    All other components within normal limits  CBC WITH DIFFERENTIAL/PLATELET - Abnormal; Notable for the following components:   MCHC 36.4 (*)    All  other components within normal limits   ____________________________________________  EKG   ____________________________________________  RADIOLOGY  No acute finding on CT the brain. ____________________________________________   PROCEDURES  Procedure(s) performed:   Procedures  Critical Care performed:   ____________________________________________   INITIAL IMPRESSION / ASSESSMENT AND PLAN / ED COURSE  Pertinent labs & imaging results that were available during my care of the patient were reviewed by me and considered in my medical decision making (see chart for details).  Differential diagnosis includes, but is not limited to, intracranial hemorrhage, meningitis/encephalitis, previous head trauma, cavernous venous thrombosis, tension headache, temporal arteritis, migraine or migraine equivalent, idiopathic intracranial hypertension, and non-specific headache. As part of my medical decision making, I reviewed the following data within the [facility]  Notes from prior ED visits  ----------------------------------------- 7:49 PM on 12/04/2017 -----------------------------------------  Patient at this time with complete relief of his headache.  Reassuring labs as well as imaging.  Slow onset.  Unlikely to be aneurysmal bleed.  Patient be discharged at this time.  Baseline neuro status.  We will follow-up with [facility] neurology.  Patient to use Fioricet at home as needed. ____________________________________________   FINAL CLINICAL IMPRESSION(S) / ED DIAGNOSES  Headache.  NEW MEDICATIONS STARTED DURING THIS VISIT:  New Prescriptions   No medications on file     Note:  This document was prepared using Dragon voice recognition software and may include unintentional dictation errors.     Orbie Pyo, MD 12/04/17 [phone]

## 2017-12-04 NOTE — ED Triage Notes (Addendum)
Frontal HA x few days. Hx brain aneurysm. Denies injury. Denies vision changes. Alert, oriented. No distress noted. Denies blood thinner use. Speech clear, moving all extremities on own.

## 2017-12-11 ENCOUNTER — Encounter: Payer: Self-pay | Admitting: Emergency Medicine

## 2017-12-11 ENCOUNTER — Other Ambulatory Visit: Payer: Self-pay

## 2017-12-11 ENCOUNTER — Emergency Department
Admission: EM | Admit: 2017-12-11 | Discharge: 2017-12-11 | Disposition: A | Discharge status: Home / Self Care | Payer: Medicare HMO | Attending: Emergency Medicine | Admitting: Emergency Medicine

## 2017-12-11 ENCOUNTER — Emergency Department: Payer: Medicare HMO

## 2017-12-11 DIAGNOSIS — N183 Chronic kidney disease, stage 3 (moderate): Secondary | ICD-10-CM | POA: Insufficient documentation

## 2017-12-11 DIAGNOSIS — Z8673 Personal history of transient ischemic attack (TIA), and cerebral infarction without residual deficits: Secondary | ICD-10-CM | POA: Diagnosis not present

## 2017-12-11 DIAGNOSIS — Z79899 Other long term (current) drug therapy: Secondary | ICD-10-CM | POA: Insufficient documentation

## 2017-12-11 DIAGNOSIS — R29810 Facial weakness: Secondary | ICD-10-CM | POA: Diagnosis present

## 2017-12-11 DIAGNOSIS — Z87891 Personal history of nicotine dependence: Secondary | ICD-10-CM | POA: Diagnosis not present

## 2017-12-11 DIAGNOSIS — Z7982 Long term (current) use of aspirin: Secondary | ICD-10-CM | POA: Insufficient documentation

## 2017-12-11 DIAGNOSIS — H9202 Otalgia, left ear: Secondary | ICD-10-CM | POA: Insufficient documentation

## 2017-12-11 DIAGNOSIS — I129 Hypertensive chronic kidney disease with stage 1 through stage 4 chronic kidney disease, or unspecified chronic kidney disease: Secondary | ICD-10-CM | POA: Insufficient documentation

## 2017-12-11 DIAGNOSIS — G510 Bell's palsy: Secondary | ICD-10-CM | POA: Insufficient documentation

## 2017-12-11 LAB — CBC
HCT: 40.7 % (ref 40.0–52.0)
Hemoglobin: 14.8 g/dL (ref 13.0–18.0)
MCH: 32.7 pg (ref 26.0–34.0)
MCHC: 36.3 g/dL — ABNORMAL HIGH (ref 32.0–36.0)
MCV: 90.1 fL (ref 80.0–100.0)
Platelets: 188 10*3/uL (ref 150–440)
RBC: 4.52 MIL/uL (ref 4.40–5.90)
RDW: 13.1 % (ref 11.5–14.5)
WBC: 7.3 10*3/uL (ref 3.8–10.6)

## 2017-12-11 LAB — BASIC METABOLIC PANEL
ANION GAP: 9 (ref 5–15)
BUN: 14 mg/dL (ref 6–20)
CALCIUM: 8.9 mg/dL (ref 8.9–10.3)
CHLORIDE: 104 mmol/L (ref 98–111)
CO2: 24 mmol/L (ref 22–32)
CREATININE: 1.59 mg/dL — AB (ref 0.61–1.24)
GFR calc non Af Amer: 48 mL/min — ABNORMAL LOW (ref >60)
GFR, EST AFRICAN AMERICAN: 55 mL/min — AB (ref >60)
Glucose, Bld: 145 mg/dL — ABNORMAL HIGH (ref 70–99)
Potassium: 3.5 mmol/L (ref 3.5–5.1)
SODIUM: 137 mmol/L (ref 135–145)

## 2017-12-11 MED ORDER — HYPROMELLOSE (GONIOSCOPIC) 2.5 % OP SOLN: 1.0000 [drp] | OPHTHALMIC | 0 refills | Status: DC | PRN

## 2017-12-11 MED ORDER — ARTIFICIAL TEARS OPHTHALMIC OINT: TOPICAL_OINTMENT | Freq: Every day | OPHTHALMIC | 1 refills | Status: DC

## 2017-12-11 MED ORDER — VALACYCLOVIR HCL 1 G PO TABS: 1000.0000 mg | ORAL_TABLET | Freq: Every day | ORAL | 0 refills | Status: AC

## 2017-12-11 MED ORDER — PREDNISONE 20 MG PO TABS: 60.0000 mg | ORAL_TABLET | Freq: Every day | ORAL | 0 refills | Status: AC

## 2017-12-11 MED ORDER — PREDNISONE 20 MG PO TABS: 60.0000 mg | ORAL_TABLET | Freq: Every day | ORAL | 0 refills | Status: DC

## 2017-12-11 MED ORDER — VALACYCLOVIR HCL 1 G PO TABS: 1000.0000 mg | ORAL_TABLET | Freq: Every day | ORAL | 0 refills | Status: DC

## 2017-12-11 NOTE — ED Notes (Signed)
EDMD at bedside

## 2017-12-11 NOTE — ED Provider Notes (Signed)
[facility] Emergency Department Provider Note  ____________________________________________  Time seen: Approximately 12:22 PM  I have reviewed the triage vital signs and the nursing notes.   HISTORY  Chief Complaint Facial Droop   HPI Hunter Weber is a 52 y.o. male with h/o Bell's palsy, CVA with L sided weakness, small brain aneurysm presents for evaluation of left-sided facial droop.  Patient reports that his last seen normal was yesterday at bedtime.  This morning he woke up with left-sided facial droop.  He is unable to fully close his left eye.  He reports a history of Bell's palsy on the right in the past.  Also has had left-sided ear pain since yesterday.  No fever or chills, no changes in his mild left-sided weakness that he has had from a previous stroke, no slurred speech, no gait instability, no headache.  Past Medical History:  Diagnosis Date  . Bell's palsy   . Epilepsy ([place])   . GERD (gastroesophageal reflux disease)   . History of depression   . Hyperlipidemia   . Hypertension   . Low libido   . Migraines   . Other convulsions   . Positive ANA (antinuclear antibody)   . Seizure disorder ([place])   . Stroke [facility])     Patient Active Problem List   Diagnosis Date Noted  . Acute ischemic right middle cerebral artery (MCA) stroke ([place]) 08/29/2014  . Aneurysm of middle cerebral artery 08/29/2014  . CKD (chronic kidney disease) stage 3, GFR 30-59 ml/min ([facility]) 05/22/2014  . 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 Depression 12/31/2013  . GERD (gastroesophageal reflux disease) 12/31/2013  . Chest pain 05/05/2012  . Hyperlipidemia 05/05/2012  . Essential hypertension 05/05/2012  . Smoking history 05/05/2012    Past Surgical History:  Procedure Laterality Date  . CEREBRAL ANGIOGRAM    . MANDIBLE FRACTURE SURGERY      Prior to Admission medications   Medication Sig Start Date End Date Taking?  Authorizing Provider  aspirin EC 81 MG tablet Take 81 mg by mouth daily.    Yes [provider]  atorvastatin (LIPITOR) 40 MG tablet Take 40 mg by mouth at bedtime. 12/01/17  Yes [provider]  baclofen (LIORESAL) 10 MG tablet Take 10 mg by mouth 2 (two) times daily. 12/08/17  Yes [provider]  FLUoxetine (PROZAC) 20 MG capsule Take 20 mg by mouth at bedtime.    Yes [provider]  levETIRAcetam (KEPPRA) 1000 MG tablet Take 1,000 mg by mouth 2 (two) times daily.    Yes [provider]  lisinopril (PRINIVIL,ZESTRIL) 10 MG tablet Take 10 mg by mouth at bedtime.    Yes [provider]  LORazepam (ATIVAN) 1 MG tablet Take 1 mg by mouth as needed for seizure.  02/28/15  Yes [provider]  losartan (COZAAR) 100 MG tablet Take 100 mg by mouth at bedtime.    Yes [provider]  omeprazole (PRILOSEC) 20 MG capsule Take 20 mg by mouth daily. 10/20/17  Yes [provider]  artificial tears (LACRILUBE) OINT ophthalmic ointment Place into the left eye at bedtime. 12/11/17   Rudene Re, MD  dicyclomine (BENTYL) 10 MG capsule Take 1 capsule (10 mg total) by mouth 4 (four) times daily. Patient not taking: Reported on 12/11/2017 02/12/16 02/19/16  Merlyn Lot, MD  fenofibrate 160 MG tablet Take 1 tablet (160 mg total) by mouth daily. Patient not taking: Reported on 12/11/2017 05/07/13  Minna Merritts, MD  HYDROcodone-acetaminophen (NORCO) 5-325 MG tablet Take 1 tablet by mouth every 4 (four) hours as needed for moderate pain. Patient not taking: Reported on 12/11/2017 02/12/16   Merlyn Lot, MD  hydroxypropyl methylcellulose / hypromellose (ISOPTO TEARS / GONIOVISC) 2.5 % ophthalmic solution Place 1 drop into the left eye every hour as needed for dry eyes. 12/11/17   Rudene Re, MD  predniSONE (DELTASONE) 20 MG tablet Take 3 tablets (60 mg total) by mouth daily for 7 days. 12/11/17 12/18/17  Rudene Re, MD  valACYclovir (VALTREX) 1000 MG tablet Take 1 tablet (1,000 mg total) by mouth daily for 7 days. 12/11/17 12/18/17  Rudene Re, MD    Allergies Diphenhydramine; Lisinopril; Penicillins; and Shellfish allergy  Family History  Problem Relation Age of Onset  . Hypertension Mother   . Heart disease Father   . Heart attack Father   . Heart attack Brother     Social History Social History   Tobacco Use  . Smoking status: Former Smoker    Packs/day: 2.00    Years: 20.00    Pack years: 40.00    Types: Cigarettes  . Smokeless tobacco: Never Used  Substance Use Topics  . Alcohol use: No  . Drug use: No    Review of Systems  Constitutional: Negative for fever. Eyes: Negative for visual changes. ENT: Negative for sore throat. Neck: No neck pain  Cardiovascular: Negative for chest pain. Respiratory: Negative for shortness of breath. Gastrointestinal: Negative for abdominal pain, vomiting or diarrhea. Genitourinary: Negative for dysuria. Musculoskeletal: Negative for back pain. Skin: Negative for rash. Neurological: Negative for headaches. + L sided facial droop Psych: No SI or HI  ____________________________________________   PHYSICAL EXAM:  VITAL SIGNS: ED Triage Vitals  Enc Vitals Group     BP 12/11/17 1142 124/74     Pulse Rate 12/11/17 1142 64     Resp 12/11/17 1142 18     Temp 12/11/17 1142 98.2 F (36.8 C)     Temp Source 12/11/17 1142 Oral     SpO2 12/11/17 1142 97 %     Weight 12/11/17 1143 230 lb (104.3 kg)     Height 12/11/17 1143 5\' 8"  (1.727 m)     Head Circumference --      Peak Flow --      Pain Score 12/11/17 1143 5     Pain Loc --      Pain Edu? --      Excl. in [place]? --     Constitutional: Alert and oriented. Well appearing and in no apparent distress. HEENT:      Head: Normocephalic and atraumatic.         Eyes: Conjunctivae are normal. Sclera is non-icteric.       Mouth/Throat: Mucous membranes are moist.       Neck:  Supple with no signs of meningismus. Cardiovascular: Regular rate and rhythm. No murmurs, gallops, or rubs. 2+ symmetrical distal pulses are present in all extremities. No JVD. Respiratory: Normal respiratory effort. Lungs are clear to auscultation bilaterally. No wheezes, crackles, or rhonchi.  Gastrointestinal: Soft, non tender, and non distended with positive bowel sounds. No rebound or guarding. Musculoskeletal: Nontender with normal range of motion in all extremities. No edema, cyanosis, or erythema of extremities. Neurologic: Normal speech and language. L sided facial droop involving the forehead, unable to fully close his L eyelid, intact sensation, normal strength and sensation x 4, no dysmetria or pronator drift, normal gait. Tongue  is midline and moves from side to side, EOMI, intact visual fields.  Skin: Skin is warm, dry and intact. No rash noted. Psychiatric: Mood and affect are normal. Speech and behavior are normal.  ____________________________________________   LABS (all labs ordered are listed, but only abnormal results are displayed)  Labs Reviewed  CBC - Abnormal; Notable for the following components:      Result Value   MCHC 36.3 (*)    All other components within normal limits  BASIC METABOLIC PANEL - Abnormal; Notable for the following components:   Glucose, Bld 145 (*)    Creatinine, Ser 1.59 (*)    GFR calc non Af Amer 48 (*)    GFR calc Af Amer 55 (*)    All other components within normal limits   ____________________________________________  EKG  ED ECG REPORT I, Rudene Re, the attending physician, personally viewed and interpreted this ECG.  Normal sinus rhythm, rate of 64, normal intervals, normal axis, no ST elevations or depressions.  Unchanged from prior from 2017 ____________________________________________  RADIOLOGY  I have personally reviewed the images performed during this visit and I agree with the Radiologist's  read.   Interpretation by Radiologist:  Ct Head Wo Contrast  Result Date: 12/11/2017 CLINICAL DATA:  Acute presentation with left-sided facial droop. Previous history of stroke and aneurysm. EXAM: CT HEAD WITHOUT CONTRAST TECHNIQUE: Contiguous axial images were obtained from the base of the skull through the vertex without intravenous contrast. COMPARISON:  12/04/2017 FINDINGS: Brain: No sign of acute infarction. The brainstem and cerebellum are unremarkable. There is old infarction in the right thalamus. Cerebral hemispheres elsewhere do not show any abnormality. No mass lesion, hemorrhage, hydrocephalus or extra-axial collection. Vascular: There is atherosclerotic calcification of the major vessels at the base of the brain. Skull: Negative Sinuses/Orbits: Clear/normal Other: None IMPRESSION: No acute finding by CT. Old infarctions in the right thalamus as seen previously. Electronically Signed   By: Nelson Chimes M.D.   On: 12/11/2017 12:42   Mr Brain Wo Contrast  Result Date: 12/11/2017 CLINICAL DATA:  Acute presentation with left-sided facial droop. EXAM: MRI HEAD WITHOUT CONTRAST TECHNIQUE: Multiplanar, multiecho pulse sequences of the brain and surrounding structures were obtained without intravenous contrast. COMPARISON:  CT same day.  MRI 01/29/2017. FINDINGS: Brain: Diffusion imaging does not show any acute or subacute infarction. The brainstem and cerebellum. There is old infarction right medial thalamus. Cerebral hemispheres elsewhere show minimal small vessel change white matter unchanged previous study. No large vessel territory infarction. Mass lesion, hemorrhage hydrocephalus or extra-axial collection Vascular: Major vessels at the base of the brain show flow. Skull and upper cervical spine: Negative Sinuses/Orbits: Clear/ Other: None IMPRESSION: No acute finding. No cause of left facial droop identified. No change since the previous study. Old infarctions medial right thalamus. Minimal small  vessel change of the hemispheric. Electronically Signed   By: Nelson Chimes M.D.   On: 12/11/2017 14:01      ____________________________________________   PROCEDURES  Procedure(s) performed: None Procedures Critical Care performed:  None ____________________________________________   INITIAL IMPRESSION / ASSESSMENT AND PLAN / ED COURSE  52 y.o. male with h/o Bell's palsy, CVA with L sided weakness, small brain aneurysm presents for evaluation of left-sided facial droop.  On exam I am more concerned for Bell's palsy since patient has inability to lift to his left forehead and close his eye fully especially with a history of prior Bell's palsy and now complaining of ear ache.  Ear exam is  unremarkable.  However since patient has a history of a brain aneurysm, and prior stroke in the past I will pursue a head CT and MRI to rule out an acute stroke.  If that is negative patient will be sent home on antiviral, prednisone, and eye care.    _________________________ 2:40 PM on 12/11/2017 -----------------------------------------  CT and MRI negative for acute stroke.  Patient was given instructions for eye care, prescription for prednisone and valacyclovir.  Discussed return precautions and close follow-up with primary care doctor.   As part of my medical decision making, I reviewed the following data within the [place] notes reviewed and incorporated, Labs reviewed , EKG interpreted , Old EKG reviewed, Old chart reviewed, Radiograph reviewed , Notes from prior ED visits and [place] Controlled Substance Database    Pertinent labs & imaging results that were available during my care of the patient were reviewed by me and considered in my medical decision making (see chart for details).    ____________________________________________   FINAL CLINICAL IMPRESSION(S) / ED DIAGNOSES  Final diagnoses:  Bell's palsy      NEW MEDICATIONS STARTED DURING THIS  VISIT:  ED Discharge Orders         Ordered    predniSONE (DELTASONE) 20 MG tablet  Daily,   Status:  Discontinued     12/11/17 1436    valACYclovir (VALTREX) 1000 MG tablet  Daily,   Status:  Discontinued     12/11/17 1436    hydroxypropyl methylcellulose / hypromellose (ISOPTO TEARS / GONIOVISC) 2.5 % ophthalmic solution  Every 1 hour PRN,   Status:  Discontinued     12/11/17 1436    artificial tears (LACRILUBE) OINT ophthalmic ointment  Daily at bedtime     12/11/17 1436    hydroxypropyl methylcellulose / hypromellose (ISOPTO TEARS / GONIOVISC) 2.5 % ophthalmic solution  Every 1 hour PRN     12/11/17 1440    predniSONE (DELTASONE) 20 MG tablet  Daily     12/11/17 1440    valACYclovir (VALTREX) 1000 MG tablet  Daily     12/11/17 1440           Note:  This document was prepared using Dragon voice recognition software and may include unintentional dictation errors.    Alfred Levins, [place], MD 12/11/17 1440

## 2017-12-11 NOTE — ED Triage Notes (Signed)
Pt presents to ED via POV with c/o L sided facial droop. Pt reports hx of stroke, and brain aneurysm. Pt states went to bed at 1030 last night and was normal, woke up this morning with L sided facial droop noted. Pt reports L sided weakness at baseline. Pt alert and oriented at this time.

## 2017-12-11 NOTE — Discharge Instructions (Signed)
You were diagnosed with Bell's palsy.  Take prednisone and valacyclovir as prescribed.  Make sure to apply artificial drops in your affected eye every hour while you are awake.  Before bedtime put the ointment in your affected eye and tape the eye shut.  Then place an eye patch over it to protect your cornea from abrasions.  You will have to do this until your Bell's palsy is resolved.  It may take several weeks for this to resolve.  Follow-up with your doctor in a week.  Return to the emergency room for slurred speech, severe headache, changes in vision, rash on your face, unilateral weakness or numbness, difficulty walking.

## 2018-05-16 ENCOUNTER — Other Ambulatory Visit: Payer: Self-pay | Admitting: Family Medicine

## 2018-05-16 DIAGNOSIS — I10 Essential (primary) hypertension: Secondary | ICD-10-CM

## 2018-05-18 ENCOUNTER — Ambulatory Visit
Admission: RE | Admit: 2018-05-18 | Discharge: 2018-05-18 | Disposition: A | Discharge status: Home / Self Care | Payer: Medicare HMO | Source: Ambulatory Visit | Attending: Family Medicine | Admitting: Family Medicine

## 2018-05-18 DIAGNOSIS — R9389 Abnormal findings on diagnostic imaging of other specified body structures: Secondary | ICD-10-CM | POA: Insufficient documentation

## 2018-05-18 DIAGNOSIS — I10 Essential (primary) hypertension: Secondary | ICD-10-CM | POA: Insufficient documentation

## 2018-05-29 ENCOUNTER — Other Ambulatory Visit: Payer: Self-pay

## 2018-05-29 ENCOUNTER — Encounter (INDEPENDENT_AMBULATORY_CARE_PROVIDER_SITE_OTHER): Payer: Self-pay | Admitting: Vascular Surgery

## 2018-05-29 ENCOUNTER — Ambulatory Visit (INDEPENDENT_AMBULATORY_CARE_PROVIDER_SITE_OTHER): Payer: Medicare HMO | Admitting: Vascular Surgery

## 2018-05-29 VITALS — BP 144/90 | HR 85 | Resp 12 | Ht 68.0 in | Wt 232.0 lb

## 2018-05-29 DIAGNOSIS — Z87891 Personal history of nicotine dependence: Secondary | ICD-10-CM

## 2018-05-29 DIAGNOSIS — I701 Atherosclerosis of renal artery: Secondary | ICD-10-CM | POA: Diagnosis not present

## 2018-05-29 DIAGNOSIS — K219 Gastro-esophageal reflux disease without esophagitis: Secondary | ICD-10-CM

## 2018-05-29 DIAGNOSIS — E782 Mixed hyperlipidemia: Secondary | ICD-10-CM | POA: Diagnosis not present

## 2018-05-29 DIAGNOSIS — I10 Essential (primary) hypertension: Secondary | ICD-10-CM

## 2018-05-30 ENCOUNTER — Encounter (INDEPENDENT_AMBULATORY_CARE_PROVIDER_SITE_OTHER): Payer: Self-pay

## 2018-05-31 ENCOUNTER — Telehealth (INDEPENDENT_AMBULATORY_CARE_PROVIDER_SITE_OTHER): Payer: Self-pay | Admitting: Vascular Surgery

## 2018-05-31 NOTE — Telephone Encounter (Signed)
Printed and placed on his desk

## 2018-05-31 NOTE — Telephone Encounter (Signed)
I would touch base with Schnier tomorrow when you see him and ask.

## 2018-05-31 NOTE — Telephone Encounter (Signed)
I see no blood thinner on current medication list and last office visit note is not complete. Please advise

## 2018-06-02 ENCOUNTER — Telehealth (INDEPENDENT_AMBULATORY_CARE_PROVIDER_SITE_OTHER): Payer: Self-pay

## 2018-06-02 ENCOUNTER — Other Ambulatory Visit (INDEPENDENT_AMBULATORY_CARE_PROVIDER_SITE_OTHER): Payer: Self-pay | Admitting: Nurse Practitioner

## 2018-06-02 DIAGNOSIS — I701 Atherosclerosis of renal artery: Secondary | ICD-10-CM

## 2018-06-02 MED ORDER — CLOPIDOGREL BISULFATE 75 MG PO TABS: 75.0000 mg | ORAL_TABLET | Freq: Every day | ORAL | 11 refills | Status: AC

## 2018-06-02 NOTE — Telephone Encounter (Signed)
Spoke with the wife and gave her the updated arrival time for his procedure on 06/06/2018 with Dr. Delana Meyer of 11:00 am.

## 2018-06-02 NOTE — Telephone Encounter (Signed)
Spoke with the patient's wife and let her know that Plavix had been called into the pharmacy for the patient.

## 2018-06-05 ENCOUNTER — Other Ambulatory Visit (INDEPENDENT_AMBULATORY_CARE_PROVIDER_SITE_OTHER): Payer: Self-pay | Admitting: Vascular Surgery

## 2018-06-05 MED ORDER — CLINDAMYCIN PHOSPHATE 300 MG/50ML IV SOLN: 300.0000 mg | Freq: Once | INTRAVENOUS | Status: DC

## 2018-06-06 ENCOUNTER — Ambulatory Visit
Admission: RE | Admit: 2018-06-06 | Discharge: 2018-06-06 | Disposition: A | Discharge status: Home / Self Care | Payer: Medicare HMO | Attending: Vascular Surgery | Admitting: Vascular Surgery

## 2018-06-06 ENCOUNTER — Encounter (INDEPENDENT_AMBULATORY_CARE_PROVIDER_SITE_OTHER): Payer: Self-pay | Admitting: Vascular Surgery

## 2018-06-06 ENCOUNTER — Other Ambulatory Visit: Payer: Self-pay

## 2018-06-06 ENCOUNTER — Encounter
Admission: RE | Disposition: A | Discharge status: Home / Self Care | Payer: Self-pay | Source: Home / Self Care | Attending: Vascular Surgery

## 2018-06-06 DIAGNOSIS — I701 Atherosclerosis of renal artery: Secondary | ICD-10-CM | POA: Insufficient documentation

## 2018-06-06 DIAGNOSIS — Z87891 Personal history of nicotine dependence: Secondary | ICD-10-CM | POA: Diagnosis not present

## 2018-06-06 DIAGNOSIS — Z79899 Other long term (current) drug therapy: Secondary | ICD-10-CM

## 2018-06-06 DIAGNOSIS — Z8249 Family history of ischemic heart disease and other diseases of the circulatory system: Secondary | ICD-10-CM | POA: Diagnosis not present

## 2018-06-06 DIAGNOSIS — G510 Bell's palsy: Secondary | ICD-10-CM | POA: Diagnosis not present

## 2018-06-06 DIAGNOSIS — Z8673 Personal history of transient ischemic attack (TIA), and cerebral infarction without residual deficits: Secondary | ICD-10-CM | POA: Insufficient documentation

## 2018-06-06 DIAGNOSIS — I10 Essential (primary) hypertension: Secondary | ICD-10-CM | POA: Diagnosis not present

## 2018-06-06 DIAGNOSIS — I150 Renovascular hypertension: Secondary | ICD-10-CM | POA: Diagnosis present

## 2018-06-06 DIAGNOSIS — Z7982 Long term (current) use of aspirin: Secondary | ICD-10-CM | POA: Diagnosis not present

## 2018-06-06 DIAGNOSIS — Z888 Allergy status to other drugs, medicaments and biological substances status: Secondary | ICD-10-CM | POA: Diagnosis not present

## 2018-06-06 DIAGNOSIS — R9389 Abnormal findings on diagnostic imaging of other specified body structures: Secondary | ICD-10-CM | POA: Diagnosis not present

## 2018-06-06 DIAGNOSIS — E782 Mixed hyperlipidemia: Secondary | ICD-10-CM | POA: Diagnosis not present

## 2018-06-06 DIAGNOSIS — Z880 Allergy status to penicillin: Secondary | ICD-10-CM | POA: Diagnosis not present

## 2018-06-06 DIAGNOSIS — K219 Gastro-esophageal reflux disease without esophagitis: Secondary | ICD-10-CM | POA: Insufficient documentation

## 2018-06-06 HISTORY — DX: Cerebral aneurysm, nonruptured: I67.1

## 2018-06-06 HISTORY — PX: RENAL ANGIOGRAPHY: CATH118260

## 2018-06-06 LAB — CREATININE, SERUM
Creatinine, Ser: 1.21 mg/dL (ref 0.61–1.24)
GFR calc non Af Amer: >60 mL/min (ref >60)

## 2018-06-06 LAB — BUN: BUN: 13 mg/dL (ref 6–20)

## 2018-06-06 SURGERY — RENAL ANGIOGRAPHY
Anesthesia: Moderate Sedation | Laterality: Left

## 2018-06-06 MED ORDER — IOPAMIDOL (ISOVUE-300) INJECTION 61%
INTRAVENOUS | Status: DC | PRN
  Administered 2018-06-06: 55 mL via INTRA_ARTERIAL

## 2018-06-06 MED ORDER — CLINDAMYCIN PHOSPHATE 300 MG/50ML IV SOLN
INTRAVENOUS | Status: AC
  Filled 2018-06-06: qty 50

## 2018-06-06 MED ORDER — ACETAMINOPHEN 325 MG PO TABS: 650.0000 mg | ORAL_TABLET | ORAL | Status: DC | PRN

## 2018-06-06 MED ORDER — LIDOCAINE HCL (PF) 1 % IJ SOLN
INTRAMUSCULAR | Status: AC
  Filled 2018-06-06: qty 30

## 2018-06-06 MED ORDER — ONDANSETRON HCL 4 MG/2ML IJ SOLN: 4.0000 mg | Freq: Four times a day (QID) | INTRAMUSCULAR | Status: DC | PRN

## 2018-06-06 MED ORDER — FAMOTIDINE 20 MG PO TABS
ORAL_TABLET | ORAL | Status: AC
  Administered 2018-06-06: 40 mg via ORAL
  Filled 2018-06-06: qty 2

## 2018-06-06 MED ORDER — SODIUM CHLORIDE 0.9 % IV SOLN: 250.0000 mL | INTRAVENOUS | Status: DC | PRN

## 2018-06-06 MED ORDER — METHYLPREDNISOLONE SODIUM SUCC 125 MG IJ SOLR
125.0000 mg | INTRAMUSCULAR | Status: DC | PRN
  Administered 2018-06-06: 125 mg via INTRAVENOUS

## 2018-06-06 MED ORDER — MIDAZOLAM HCL 5 MG/5ML IJ SOLN
INTRAMUSCULAR | Status: AC
  Filled 2018-06-06: qty 5

## 2018-06-06 MED ORDER — MIDAZOLAM HCL 2 MG/ML PO SYRP: 8.0000 mg | ORAL_SOLUTION | Freq: Once | ORAL | Status: DC | PRN

## 2018-06-06 MED ORDER — FAMOTIDINE 20 MG PO TABS
40.0000 mg | ORAL_TABLET | ORAL | Status: DC | PRN
  Administered 2018-06-06: 40 mg via ORAL

## 2018-06-06 MED ORDER — HYDRALAZINE HCL 20 MG/ML IJ SOLN: 5.0000 mg | INTRAMUSCULAR | Status: DC | PRN

## 2018-06-06 MED ORDER — FENTANYL CITRATE (PF) 100 MCG/2ML IJ SOLN
INTRAMUSCULAR | Status: AC
  Filled 2018-06-06: qty 2

## 2018-06-06 MED ORDER — METHYLPREDNISOLONE SODIUM SUCC 125 MG IJ SOLR
INTRAMUSCULAR | Status: AC
  Administered 2018-06-06: 125 mg via INTRAVENOUS
  Filled 2018-06-06: qty 2

## 2018-06-06 MED ORDER — MORPHINE SULFATE (PF) 4 MG/ML IV SOLN: 2.0000 mg | INTRAVENOUS | Status: DC | PRN

## 2018-06-06 MED ORDER — SODIUM CHLORIDE 0.9 % IV SOLN: INTRAVENOUS | Status: DC

## 2018-06-06 MED ORDER — LABETALOL HCL 5 MG/ML IV SOLN: 10.0000 mg | INTRAVENOUS | Status: DC | PRN

## 2018-06-06 MED ORDER — HEPARIN SODIUM (PORCINE) 1000 UNIT/ML IJ SOLN
INTRAMUSCULAR | Status: AC
  Filled 2018-06-06: qty 1

## 2018-06-06 MED ORDER — SODIUM CHLORIDE 0.9% FLUSH: 3.0000 mL | Freq: Two times a day (BID) | INTRAVENOUS | Status: DC

## 2018-06-06 MED ORDER — SODIUM CHLORIDE 0.9% FLUSH: 3.0000 mL | INTRAVENOUS | Status: DC | PRN

## 2018-06-06 MED ORDER — HEPARIN (PORCINE) IN NACL 1000-0.9 UT/500ML-% IV SOLN
INTRAVENOUS | Status: AC
  Filled 2018-06-06: qty 1000

## 2018-06-06 MED ORDER — OXYCODONE HCL 5 MG PO TABS: 5.0000 mg | ORAL_TABLET | ORAL | Status: DC | PRN

## 2018-06-06 MED ORDER — FENTANYL CITRATE (PF) 100 MCG/2ML IJ SOLN
INTRAMUSCULAR | Status: DC | PRN
  Administered 2018-06-06 (×2): 50 ug via INTRAVENOUS
  Administered 2018-06-06: 25 ug via INTRAVENOUS

## 2018-06-06 MED ORDER — SODIUM CHLORIDE 0.9 % IV SOLN
INTRAVENOUS | Status: DC
  Administered 2018-06-06: 11:00:00 via INTRAVENOUS

## 2018-06-06 MED ORDER — HYDROMORPHONE HCL 1 MG/ML IJ SOLN: 1.0000 mg | Freq: Once | INTRAMUSCULAR | Status: DC | PRN

## 2018-06-06 MED ORDER — MIDAZOLAM HCL 2 MG/2ML IJ SOLN
INTRAMUSCULAR | Status: DC | PRN
  Administered 2018-06-06: 2 mg via INTRAVENOUS
  Administered 2018-06-06: 0.5 mg via INTRAVENOUS
  Administered 2018-06-06: 1 mg via INTRAVENOUS

## 2018-06-06 SURGICAL SUPPLY — 13 items
CATH PIG 70CM (CATHETERS) ×2 IMPLANT
CATH VS15FR (CATHETERS) ×2 IMPLANT
DEVICE STARCLOSE SE CLOSURE (Vascular Products) ×2 IMPLANT
DEVICE TORQUE .025-.038 (MISCELLANEOUS) ×2 IMPLANT
GUIDEWIRE ANGLED .035 180CM (WIRE) ×2 IMPLANT
NEEDLE ENTRY 21GA 7CM ECHOTIP (NEEDLE) ×2 IMPLANT
PACK ANGIOGRAPHY (CUSTOM PROCEDURE TRAY) ×2 IMPLANT
SET INTRO CAPELLA COAXIAL (SET/KITS/TRAYS/PACK) ×2 IMPLANT
SHEATH BRITE TIP 5FRX11 (SHEATH) ×2 IMPLANT
SYR MEDRAD MARK V 150ML (SYRINGE) ×2 IMPLANT
TOWEL OR 17X26 4PK STRL BLUE (TOWEL DISPOSABLE) ×2 IMPLANT
TUBING CONTRAST HIGH PRESS 72 (TUBING) ×2 IMPLANT
WIRE J 3MM .035X145CM (WIRE) ×2 IMPLANT

## 2018-06-06 NOTE — Progress Notes (Signed)
MRN : [id number]  Hunter Weber is a 53 y.o. ([birth year]) male who presents with chief complaint of  Chief Complaint  Patient presents with  . New Patient (Initial Visit)  .  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 The patient has a long history of hypertension and recently has multiple changes in his medical therapy. The patient is consistently documented systolic blood pressures near 315 with diastolic pressures over 90.  There is already a history of a CVA and he also has a documented cerebral aneurysm and so management of his hypertension has become a primary issue.  The patient does have family history of hypertension.   There is no prior documented abdominal bruit. The patient occasionally has flushing symptoms but denies palpitations. No episodes of syncope.There is no history of headache. There is no history of flash pulmonary edema.  The patient denies a history of renal disease.  The patient denies amaurosis fugax or recent TIA symptoms. There are no recent neurological changes noted. The patient denies claudication symptoms or rest pain symptoms. The patient denies history of DVT, PE or superficial thrombophlebitis. The patient denies recent episodes of angina or shortness of breath.   Duplex ultrasound of the renal arteries demonstrates greater than 70% stenosis of the left renal artery  Current Meds  Medication Sig  . amLODipine (NORVASC) 10 MG tablet Take 10 mg by mouth daily.   Marland Kitchen aspirin EC 81 MG tablet Take 81 mg by mouth daily.   Marland Kitchen atorvastatin (LIPITOR) 40 MG tablet Take 40 mg by mouth at bedtime.  . baclofen (LIORESAL) 10 MG tablet Take 10 mg by mouth 2 (two) times daily as needed for muscle spasms.   . cetirizine (ZYRTEC) 10 MG chewable tablet Chew 10 mg by mouth daily.  Marland Kitchen FLUoxetine (PROZAC) 20 MG capsule Take 20 mg by mouth at bedtime.   . levETIRAcetam (KEPPRA)  1000 MG tablet Take 1,000 mg by mouth 2 (two) times daily.   Marland Kitchen LORazepam (ATIVAN) 1 MG tablet Take 1 mg by mouth daily as needed for seizure.   Marland Kitchen losartan (COZAAR) 100 MG tablet Take 100 mg by mouth at bedtime.   Marland Kitchen omeprazole (PRILOSEC) 20 MG capsule Take 20 mg by mouth daily.  . [DISCONTINUED] HYDROcodone-acetaminophen (NORCO) 5-325 MG tablet Take 1 tablet by mouth every 4 (four) hours as needed for moderate pain.    Past Medical History:  Diagnosis Date  . Bell's palsy   . Epilepsy ([place])   . GERD (gastroesophageal reflux disease)   . History of depression   . Hyperlipidemia   . Hypertension   . Low libido   . Migraines   . Other convulsions   . Positive ANA (antinuclear antibody)   . Seizure disorder ([place])   . Stroke [facility])     Past Surgical History:  Procedure Laterality Date  . CEREBRAL ANGIOGRAM    . MANDIBLE FRACTURE SURGERY      Social History Social History   Tobacco Use  . Smoking status: Former Smoker    Packs/day: 2.00    Years: 20.00    Pack years: 40.00    Types: Cigarettes  . Smokeless tobacco: Never Used  Substance Use Topics  . Alcohol use: No  . Drug use: No    Family History Family History  Problem Relation Age of Onset  . Hypertension Mother   . Heart disease Father   . Heart  attack Father   . Heart attack Brother   No family history of bleeding/clotting disorders, porphyria or autoimmune disease   Allergies  Allergen Reactions  . Diphenhydramine Other (See Comments)    Induces seizures  . Lisinopril   . Penicillins Other (See Comments)    Has patient had a PCN reaction causing immediate rash, facial/tongue/throat swelling, SOB or lightheadedness with hypotension: Unknown Has patient had a PCN reaction causing severe rash involving mucus membranes or skin necrosis: Unknown Has patient had a PCN reaction that required hospitalization: Unknown Has patient had a PCN reaction occurring within the last 10 years: Yes If all of the above  answers are "NO", then may proceed with Cephalosporin use.   . Shellfish Allergy      REVIEW OF SYSTEMS (Negative unless checked)  Constitutional: [] Weight loss  [] Fever  [] Chills Cardiac: [] Chest pain   [] Chest pressure   [x] Palpitations   [] Shortness of breath when laying flat   [] Shortness of breath with exertion. Vascular:  [] Pain in legs with walking   [] Pain in legs at rest  [] History of DVT   [] Phlebitis   [] Swelling in legs   [] Varicose veins   [] Non-healing ulcers Pulmonary:   [] Uses home oxygen   [] Productive cough   [] Hemoptysis   [] Wheeze  [] COPD   [] Asthma Neurologic:  [] Dizziness   [] Seizures   [x] History of stroke   [] History of TIA  [] Aphasia   [] Vissual changes   [] Weakness or numbness in arm   [] Weakness or numbness in leg Musculoskeletal:   [] Joint swelling   [] Joint pain   [] Low back pain Hematologic:  [] Easy bruising  [] Easy bleeding   [] Hypercoagulable state   [] Anemic Gastrointestinal:  [] Diarrhea   [] Vomiting  [x] Gastroesophageal reflux/heartburn   [] Difficulty swallowing. Genitourinary:  [] Chronic kidney disease   [] Difficult urination  [] Frequent urination   [] Blood in urine Skin:  [] Rashes   [] Ulcers  Psychological:  [] History of anxiety   [x]  History of depression.  Physical Examination  Vitals:   05/29/18 1521  BP: (!) 144/90  Pulse: 85  Resp: 12  Weight: 232 lb (105.2 kg)  Height: 5\' 8"  (1.727 m)   Body mass index is 35.28 kg/m. Gen: WD/WN, NAD Head: [place]/AT, No temporalis wasting.  Ear/Nose/Throat: Hearing grossly intact, nares w/o erythema or drainage, poor dentition Eyes: PER, EOMI, sclera nonicteric.  Neck: Supple, no masses.  No bruit or JVD.  Pulmonary:  Good air movement, clear to auscultation bilaterally, no use of accessory muscles.  Cardiac: RRR, normal S1, S2, no Murmurs. Vascular:  Vessel Right Left  Radial Palpable Palpable  Ulnar Palpable Palpable  Brachial Palpable Palpable  Carotid Palpable Palpable  Gastrointestinal: soft,  non-distended. No guarding/no peritoneal signs.  Musculoskeletal: M/S 5/5 throughout.  No deformity or atrophy.  Neurologic: CN 2-12 intact. Pain and light touch intact in extremities.  Symmetrical.  Speech is fluent. Motor exam as listed above. Psychiatric: Judgment intact, Mood & affect appropriate for pt's clinical situation. Dermatologic: No rashes or ulcers noted.  No changes consistent with cellulitis. Lymph : No Cervical lymphadenopathy, no lichenification or skin changes of chronic lymphedema.  CBC Lab Results  Component Value Date   WBC 7.3 12/11/2017   HGB 14.8 12/11/2017   HCT 40.7 12/11/2017   MCV 90.1 12/11/2017   PLT 188 12/11/2017    BMET    Component Value Date/Time   NA 137 12/11/2017 1150   NA 139 08/14/2014 1238   K 3.5 12/11/2017 1150   K 3.8 08/14/2014 1238  CL 104 12/11/2017 1150   CL 105 08/14/2014 1238   CO2 24 12/11/2017 1150   CO2 28 08/14/2014 1238   GLUCOSE 145 (H) 12/11/2017 1150   GLUCOSE 81 08/14/2014 1238   BUN 14 12/11/2017 1150   BUN 14 08/14/2014 1238   CREATININE 1.59 (H) 12/11/2017 1150   CREATININE 1.47 (H) 08/14/2014 1238   CALCIUM 8.9 12/11/2017 1150   CALCIUM 9.3 08/14/2014 1238   GFRNONAA 48 (L) 12/11/2017 1150   GFRNONAA 55 (L) 08/14/2014 1238   GFRAA 55 (L) 12/11/2017 1150   GFRAA >60 08/14/2014 1238   CrCl cannot be calculated (Patient's most recent lab result is older than the maximum 21 days allowed.).  COAG Lab Results  Component Value Date   INR 1.16 10/07/2015   INR 1.02 07/18/2015   INR 1.2 08/14/2014    Radiology US Renal Artery Duplex Complete  Result Date: 05/18/2018 CLINICAL DATA:  Resistant hypertension EXAM: RENAL DUPLEX DOPPLER ULTRASOUND COMPARISON:  None. FINDINGS: Right Kidney: Length: 11.2. Echogenicity within normal limits. No mass or hydronephrosis visualized. Left Kidney: Length: 12.3. Echogenicity within normal limits. No mass or hydronephrosis visualized. Bladder:  Within normal limits. RENAL  DUPLEX ULTRASOUND Right Renal Artery Velocities: Origin:  124 cm/sec Mid:  129 cm/sec Hilum:  110 cm/sec Interlobar:  47 cm/sec Arcuate:  28 cm/sec Left Renal Artery Velocities: Origin:  145 cm/sec Mid:  224 cm/sec Hilum:  118 cm/sec Interlobar:  30 cm/sec Arcuate:  26 cm/sec Aortic Velocity:  93 cm/sec Right Renal-Aortic Ratios: Origin: 1.3 Mid:  1.4 Hilum: 1.2 Interlobar: 0.5 Arcuate: 0.3 Left Renal-Aortic Ratios: Origin: 1.6 Mid: 2.4 Hilum: 1.3 Interlobar: 0.3 Arcuate: 0.3 Renal veins are patent bilaterally. IMPRESSION: There is a focally elevated velocity in the mid left renal artery. Significant narrowing in the left renal artery is not excluded. Further characterization with CTA or MRA of the renal arteries is recommended. Electronically Signed   By: Marybelle Killings M.D.   On: 05/18/2018 11:43     Assessment/Plan 1. Malignant hypertension Recommend:  The patient has evidence of severe atherosclerotic changes of the renal artery with worsening of the atherosclerosis of the renal arteries associated with severe hypertension.  This represents a high risk for CVA, MI and renal failure.  Given his history of CVA already as well as of a cerebral aneurysm this puts him at increased risk therefore, the patient requires aggressive control of his blood pressure.  Patient should undergo bilateral angiography of the renal artery with the hope for intervention of the left renal artery for control of hypertension.  The risks and benefits as well as the alternative therapies was discussed in detail with the patient.  All questions were answered.  Patient agrees to proceed with angiography and intervention of the left renal artery.    A total of 65 minutes was spent with this patient and greater than 50% was spent in counseling and coordination of care with the patient.  Discussion included the treatment options for vascular disease including indications for surgery and intervention.  Also discussed is the  appropriate timing of treatment.  In addition medical therapy was discussed.   2. Renal artery stenosis (HCC) Recommend:  The patient has evidence of severe atherosclerotic changes of the renal artery with worsening of the atherosclerosis of the renal arteries associated with severe hypertension.  This represents a high risk for CVA, MI and renal failure.  Given his history of CVA already as well as of a cerebral aneurysm this puts him at  increased risk therefore, the patient requires aggressive control of his blood pressure.  Patient should undergo bilateral angiography of the renal artery with the hope for intervention of the left renal artery for control of hypertension.  The risks and benefits as well as the alternative therapies was discussed in detail with the patient.  All questions were answered.  Patient agrees to proceed with angiography and intervention of the left renal artery.   3. Gastroesophageal reflux disease without esophagitis Continue PPI as already ordered, this medication has been reviewed and there are no changes at this time.  Avoidence of caffeine and alcohol  Moderate elevation of the head of the bed   4. Mixed hyperlipidemia Continue statin as ordered and reviewed, no changes at this time      Hortencia Pilar, MD  06/06/2018 10:41 AM

## 2018-06-06 NOTE — Discharge Instructions (Signed)

## 2018-06-06 NOTE — Op Note (Signed)
[facility]  Percutaneous Study/Intervention Procedural Note   Date of Surgery: 06/06/2018,3:53 PM  Surgeon:Schnier, Dolores Lory   Pre-operative Diagnosis: Left renal artery stenosis; malignant hypertension  Post-operative diagnosis: Malignant hypertension; renal arteries without stricture or stenosis  Procedure(s) Performed:  1.  Abdominal aortogram  2.  Selective injection left renal artery  3.  Selective injection right renal artery  4.  Star close right common femoral    Anesthesia: Conscious sedation was administered by the interventional radiology RN under my direct supervision. IV Versed plus fentanyl were utilized. Continuous ECG, pulse oximetry and blood pressure was monitored throughout the entire procedure.  Conscious sedation was administered for a total of 32 minutes.  Sheath: 5 French Pinnacle sheath retrograde right common femoral  Contrast: 55 cc   Fluoroscopy Time: 6.2 minutes  Indications:  The patient presents to [facility] with with malignant hypertension poorly controlled on multiple antihypertensive medications.  Duplex ultrasound suggests a left renal artery stricture that is hemodynamically significant.  Indications for renal artery revascularization are reviewed with the patient all questions are answered the patient agrees to proceed.  The patient is therefore undergoing angiography with the hope for intervention for control of his malignant hypertension.   Procedure:  Hunter Weber a 53 y.o. male who was identified and appropriate procedural time out was performed.  The patient was then placed supine on the table and prepped and draped in the usual sterile fashion.  Ultrasound was used to evaluate the right common femoral artery.  It was echolucent and pulsatile indicating it is patent .  An ultrasound image was acquired for the permanent record.  A micropuncture needle was used to access the right common femoral artery under  direct ultrasound guidance.  The microwire was then advanced under fluoroscopic guidance without difficulty followed by the micro-sheath.  A 0.035 J wire was advanced without resistance and a 5Fr sheath was placed.    Pigtail catheter was then advanced to the level of T12 and AP projection of the aorta was obtained.   After review of the images a floppy glide wire is introduced and the pigtail catheter removed a VS 1 catheter is then introduced and reformed in the descending aorta.  The left renal artery is then selected with the V S1 catheter and hand-injection of contrast was used to demonstrate the renal artery anatomy in magnified imaging.  After review of these images the V S1 catheter is advanced back into the descending aorta flipped to the opposite side and used to select the right renal artery.  Again hand-injection of contrast was performed to obtain imaging of the right renal arterial system.  After review of the images the catheter was removed over wire and an RAO view of the groin was obtained. StarClose device was deployed without difficulty.   Findings:   Aortogram: The abdominal aorta is opacified with a bolus injection contrast.  Bilateral nephrograms are noted they are normal in diameter.  No stricture or stenosis of the aorta is identified.  Right renal artery: The right renal artery is widely patent no evidence of stricture or stenosis.  No evidence of aneurysmal changes or FMD.  Left renal artery:  The left renal artery is widely patent no evidence of stricture or stenosis.  No evidence of aneurysmal changes or FMD.  Summary: No evidence of hemodynamically significant renal artery stenosis bilaterally.  There is no indication for intervention.  His blood pressure will be managed with aggressive medical therapy  Disposition: Patient was taken to the recovery room in stable condition having tolerated the procedure well.  Belenda Cruise Schnier 06/06/2018,3:53 PM

## 2018-06-06 NOTE — H&P (Signed)
 [facility] History & Physical Update  The patient was interviewed and re-examined.  The patient's previous History and Physical has been reviewed and is unchanged.  There is no change in the plan of care. We plan to proceed with the scheduled procedure.  The patient's office visit from earlier this month has been reviewed and verified and completed.  Hortencia Pilar, MD  06/06/2018, 10:47 AM

## 2018-06-07 ENCOUNTER — Encounter: Payer: Self-pay | Admitting: Vascular Surgery

## 2018-06-08 ENCOUNTER — Telehealth (INDEPENDENT_AMBULATORY_CARE_PROVIDER_SITE_OTHER): Payer: Self-pay | Admitting: Vascular Surgery

## 2018-06-08 NOTE — Telephone Encounter (Signed)
Pt wife called to ask if patient needs to continue Plavix med started before procedure.   I spoke with Dr. Ronalee Belts, he advised patient could DC plavix but to start taking 81mg  Aspirin.   Pt is aware of this information. AS, CMA

## 2018-06-22 ENCOUNTER — Ambulatory Visit (INDEPENDENT_AMBULATORY_CARE_PROVIDER_SITE_OTHER): Payer: Medicare HMO | Admitting: Vascular Surgery

## 2018-06-22 ENCOUNTER — Encounter (INDEPENDENT_AMBULATORY_CARE_PROVIDER_SITE_OTHER): Payer: Self-pay | Admitting: Vascular Surgery

## 2018-06-22 ENCOUNTER — Other Ambulatory Visit: Payer: Self-pay

## 2018-06-22 VITALS — BP 127/82 | HR 66 | Resp 16 | Ht 68.0 in | Wt 232.0 lb

## 2018-06-22 DIAGNOSIS — E782 Mixed hyperlipidemia: Secondary | ICD-10-CM

## 2018-06-22 DIAGNOSIS — I129 Hypertensive chronic kidney disease with stage 1 through stage 4 chronic kidney disease, or unspecified chronic kidney disease: Secondary | ICD-10-CM

## 2018-06-22 DIAGNOSIS — N183 Chronic kidney disease, stage 3 unspecified: Secondary | ICD-10-CM

## 2018-06-22 DIAGNOSIS — Z87891 Personal history of nicotine dependence: Secondary | ICD-10-CM

## 2018-06-22 DIAGNOSIS — K219 Gastro-esophageal reflux disease without esophagitis: Secondary | ICD-10-CM

## 2018-06-22 DIAGNOSIS — Z79899 Other long term (current) drug therapy: Secondary | ICD-10-CM

## 2018-06-22 DIAGNOSIS — I10 Essential (primary) hypertension: Secondary | ICD-10-CM

## 2018-06-22 NOTE — Progress Notes (Signed)
MRN : [id number]  Hunter Weber is a 53 y.o. (07/06/1965) male who presents with chief complaint of  Chief Complaint  Patient presents with  . Follow-up    2 week f/u from [facility]  .  History of Present Illness:   The patient returns to the office for followup and review status post renal artery angiogram without intervention. No hemodynamically significant renal artery stenosis was identified  There have been no significant changes to the patient's overall health care.  The patient denies amaurosis fugax or recent TIA symptoms. There are no recent neurological changes noted. The patient denies history of DVT, PE or superficial thrombophlebitis. The patient denies recent episodes of angina or shortness of breath.     Current Meds  Medication Sig  . amLODipine (NORVASC) 10 MG tablet Take 10 mg by mouth daily.   Marland Kitchen aspirin EC 81 MG tablet Take 81 mg by mouth daily.   Marland Kitchen atorvastatin (LIPITOR) 40 MG tablet Take 40 mg by mouth at bedtime.  . baclofen (LIORESAL) 10 MG tablet Take 10 mg by mouth 2 (two) times daily as needed for muscle spasms.   . cetirizine (ZYRTEC) 10 MG chewable tablet Chew 10 mg by mouth daily.  Marland Kitchen FLUoxetine (PROZAC) 20 MG capsule Take 20 mg by mouth at bedtime.   . levETIRAcetam (KEPPRA) 1000 MG tablet Take 1,000 mg by mouth 2 (two) times daily.   Marland Kitchen LORazepam (ATIVAN) 1 MG tablet Take 1 mg by mouth daily as needed for seizure.   Marland Kitchen losartan (COZAAR) 100 MG tablet Take 100 mg by mouth at bedtime.   Marland Kitchen omeprazole (PRILOSEC) 20 MG capsule Take 20 mg by mouth daily.    Past Medical History:  Diagnosis Date  . Bell's palsy   . Brain aneurysm    per patient report   . Epilepsy ([place])   . GERD (gastroesophageal reflux disease)   . History of depression   . Hyperlipidemia   . Hypertension   . Low libido   . Migraines   . Other convulsions   . Positive ANA (antinuclear antibody)   . Seizure disorder ([place])   . Stroke [facility])     Past Surgical History:  Procedure  Laterality Date  . CEREBRAL ANGIOGRAM    . MANDIBLE FRACTURE SURGERY    . RENAL ANGIOGRAPHY Left 06/06/2018   Procedure: RENAL ANGIOGRAPHY;  Surgeon: Katha Cabal, MD;  Location: [facility];  Service: Cardiovascular;  Laterality: Left;    Social History Social History   Tobacco Use  . Smoking status: Former Smoker    Packs/day: 2.00    Years: 20.00    Pack years: 40.00    Types: Cigarettes  . Smokeless tobacco: Never Used  Substance Use Topics  . Alcohol use: No  . Drug use: No    Family History Family History  Problem Relation Age of Onset  . Hypertension Mother   . Heart disease Father   . Heart attack Father   . Heart attack Brother     Allergies  Allergen Reactions  . Diphenhydramine Other (See Comments)    Induces seizures  . Lisinopril   . Penicillins Other (See Comments)    Has patient had a PCN reaction causing immediate rash, facial/tongue/throat swelling, SOB or lightheadedness with hypotension: Unknown Has patient had a PCN reaction causing severe rash involving mucus membranes or skin necrosis: Unknown Has patient had a PCN reaction that required hospitalization: Unknown Has patient had a PCN reaction occurring  within the last 10 years: Yes If all of the above answers are "NO", then may proceed with Cephalosporin use.   . Shellfish Allergy      REVIEW OF SYSTEMS (Negative unless checked)  Constitutional: [] Weight loss  [] Fever  [] Chills Cardiac: [] Chest pain   [] Chest pressure   [] Palpitations   [] Shortness of breath when laying flat   [] Shortness of breath with exertion. Vascular:  [] Pain in legs with walking   [] Pain in legs at rest  [] History of DVT   [] Phlebitis   [] Swelling in legs   [] Varicose veins   [] Non-healing ulcers Pulmonary:   [] Uses home oxygen   [] Productive cough   [] Hemoptysis   [] Wheeze  [] COPD   [] Asthma Neurologic:  [] Dizziness   [] Seizures   [] History of stroke   [] History of TIA  [] Aphasia   [] Vissual changes    [] Weakness or numbness in arm   [] Weakness or numbness in leg Musculoskeletal:   [] Joint swelling   [] Joint pain   [] Low back pain Hematologic:  [] Easy bruising  [] Easy bleeding   [] Hypercoagulable state   [] Anemic Gastrointestinal:  [] Diarrhea   [] Vomiting  [] Gastroesophageal reflux/heartburn   [] Difficulty swallowing. Genitourinary:  [x] Chronic kidney disease   [] Difficult urination  [] Frequent urination   [] Blood in urine Skin:  [] Rashes   [] Ulcers  Psychological:  [] History of anxiety   []  History of major depression.  Physical Examination  Vitals:   06/22/18 0823  BP: 127/82  Pulse: 66  Resp: 16  Weight: 232 lb (105.2 kg)  Height: 5\' 8"  (1.727 m)   Body mass index is 35.28 kg/m. Gen: WD/WN, NAD Head: [place]/AT, No temporalis wasting.  Ear/Nose/Throat: Hearing grossly intact, nares w/o erythema or drainage Eyes: PER, EOMI, sclera nonicteric.  Neck: Supple, no large masses.   Pulmonary:  Good air movement, no audible wheezing bilaterally, no use of accessory muscles.  Cardiac: RRR, no JVD Vascular:  Vessel Right Left  Radial Palpable Palpable  Gastrointestinal: Non-distended. No guarding/no peritoneal signs.  Musculoskeletal: M/S 5/5 throughout.  No deformity or atrophy.  Neurologic: CN 2-12 intact. Symmetrical.  Speech is fluent. Motor exam as listed above. Psychiatric: Judgment intact, Mood & affect appropriate for pt's clinical situation. Dermatologic: No rashes or ulcers noted.  No changes consistent with cellulitis. Lymph : No lichenification or skin changes of chronic lymphedema.  CBC Lab Results  Component Value Date   WBC 7.3 12/11/2017   HGB 14.8 12/11/2017   HCT 40.7 12/11/2017   MCV 90.1 12/11/2017   PLT 188 12/11/2017    BMET    Component Value Date/Time   NA 137 12/11/2017 1150   NA 139 08/14/2014 1238   K 3.5 12/11/2017 1150   K 3.8 08/14/2014 1238   CL 104 12/11/2017 1150   CL 105 08/14/2014 1238   CO2 24 12/11/2017 1150   CO2 28 08/14/2014 1238     GLUCOSE 145 (H) 12/11/2017 1150   GLUCOSE 81 08/14/2014 1238   BUN 13 06/06/2018 1056   BUN 14 08/14/2014 1238   CREATININE 1.21 06/06/2018 1056   CREATININE 1.47 (H) 08/14/2014 1238   CALCIUM 8.9 12/11/2017 1150   CALCIUM 9.3 08/14/2014 1238   GFRNONAA >60 06/06/2018 1056   GFRNONAA 55 (L) 08/14/2014 1238   GFRAA >60 06/06/2018 1056   GFRAA >60 08/14/2014 1238   Estimated Creatinine Clearance: 82 mL/min (by C-G formula based on SCr of 1.21 mg/dL).  COAG Lab Results  Component Value Date   INR 1.16 10/07/2015   INR  1.02 07/18/2015   INR 1.2 08/14/2014    Radiology No results found.   Assessment/Plan 1. Malignant hypertension Continue antihypertensive medications as already ordered, these medications have been reviewed and there are no changes at this time.  No indication for intervention  2. Gastroesophageal reflux disease without esophagitis Continue PPI as already ordered, this medication has been reviewed and there are no changes at this time.  Avoidence of caffeine and alcohol  Moderate elevation of the head of the bed   3. Mixed hyperlipidemia Continue statin as ordered and reviewed, no changes at this time   4. CKD (chronic kidney disease) stage 3, GFR 30-59 ml/min (HCC) Aggressive control of hypertension, he does not have diabetes.  Avoid nephrotoxic medications     Hortencia Pilar, MD  06/22/2018 9:02 AM

## 2018-06-24 ENCOUNTER — Encounter (INDEPENDENT_AMBULATORY_CARE_PROVIDER_SITE_OTHER): Payer: Self-pay | Admitting: Vascular Surgery

## 2018-08-23 ENCOUNTER — Other Ambulatory Visit: Payer: Self-pay

## 2018-08-23 ENCOUNTER — Emergency Department
Admission: EM | Admit: 2018-08-23 | Discharge: 2018-08-23 | Disposition: A | Discharge status: Home / Self Care | Payer: Medicare HMO | Attending: Emergency Medicine | Admitting: Emergency Medicine

## 2018-08-23 ENCOUNTER — Emergency Department: Payer: Medicare HMO

## 2018-08-23 ENCOUNTER — Encounter: Payer: Self-pay | Admitting: Emergency Medicine

## 2018-08-23 DIAGNOSIS — Z87891 Personal history of nicotine dependence: Secondary | ICD-10-CM | POA: Diagnosis not present

## 2018-08-23 DIAGNOSIS — N183 Chronic kidney disease, stage 3 (moderate): Secondary | ICD-10-CM | POA: Insufficient documentation

## 2018-08-23 DIAGNOSIS — I129 Hypertensive chronic kidney disease with stage 1 through stage 4 chronic kidney disease, or unspecified chronic kidney disease: Secondary | ICD-10-CM | POA: Insufficient documentation

## 2018-08-23 DIAGNOSIS — Z79899 Other long term (current) drug therapy: Secondary | ICD-10-CM | POA: Insufficient documentation

## 2018-08-23 DIAGNOSIS — I10 Essential (primary) hypertension: Secondary | ICD-10-CM

## 2018-08-23 DIAGNOSIS — Z7982 Long term (current) use of aspirin: Secondary | ICD-10-CM | POA: Insufficient documentation

## 2018-08-23 LAB — CBC
HCT: 43.4 % (ref 39.0–52.0)
Hemoglobin: 15.6 g/dL (ref 13.0–17.0)
MCH: 31.3 pg (ref 26.0–34.0)
MCHC: 35.9 g/dL (ref 30.0–36.0)
MCV: 87 fL (ref 80.0–100.0)
Platelets: 166 10*3/uL (ref 150–400)
RBC: 4.99 MIL/uL (ref 4.22–5.81)
RDW: 13.1 % (ref 11.5–15.5)
WBC: 9.1 10*3/uL (ref 4.0–10.5)
nRBC: 0 % (ref 0.0–0.2)

## 2018-08-23 LAB — BASIC METABOLIC PANEL
Anion gap: 11 (ref 5–15)
BUN: 11 mg/dL (ref 6–20)
CO2: 25 mmol/L (ref 22–32)
Calcium: 9.3 mg/dL (ref 8.9–10.3)
Chloride: 102 mmol/L (ref 98–111)
Creatinine, Ser: 1.24 mg/dL (ref 0.61–1.24)
GFR calc Af Amer: >60 mL/min (ref >60)
GFR calc non Af Amer: >60 mL/min (ref >60)
Glucose, Bld: 98 mg/dL (ref 70–99)
Potassium: 4 mmol/L (ref 3.5–5.1)
Sodium: 138 mmol/L (ref 135–145)

## 2018-08-23 LAB — TROPONIN I: Troponin I: <0.03 ng/mL (ref <0.03)

## 2018-08-23 MED ORDER — HYDRALAZINE HCL 10 MG PO TABS: 10.0000 mg | ORAL_TABLET | Freq: Four times a day (QID) | ORAL | 0 refills | Status: AC

## 2018-08-23 MED ORDER — HYDRALAZINE HCL 20 MG/ML IJ SOLN
2.0000 mg | Freq: Once | INTRAMUSCULAR | Status: AC
  Administered 2018-08-23: 19:00:00 2 mg via INTRAVENOUS
  Filled 2018-08-23: qty 1

## 2018-08-23 NOTE — ED Provider Notes (Signed)
[facility] Emergency Department Provider Note  ____________________________________________   First MD Initiated Contact with Patient 08/23/18 1618     (approximate)  I have reviewed the triage vital signs and the nursing notes.   HISTORY  Chief Complaint Hypertension    HPI Hunter Weber is a 53 y.o. male presents emergency department with concerns of elevated blood pressure.  States his blood pressure at home is 185/105.  Patient has a history of kidney disease.  They did ultrasound and thought he had a blockage but when they went to place a stent they did not see a blockage in the area.  He states since then his blood pressure is been elevated and "all over the place.  "He states he has an aneurysm in the right side of his head that they are watching.  He has not had a headache but has felt fuzzy and a little confused.  He denies any chest pain or shortness of breath.   Former smoker, no EtOH use   Past Medical History:  Diagnosis Date  . Bell's palsy   . Brain aneurysm    per patient report   . Epilepsy ([place])   . GERD (gastroesophageal reflux disease)   . History of depression   . Hyperlipidemia   . Hypertension   . Low libido   . Migraines   . Other convulsions   . Positive ANA (antinuclear antibody)   . Seizure disorder ([place])   . Stroke [facility])     Patient Active Problem List   Diagnosis Date Noted  . Acute ischemic right middle cerebral artery (MCA) stroke ([place]) 08/29/2014  . Aneurysm of middle cerebral artery 08/29/2014  . CKD (chronic kidney disease) stage 3, GFR 30-59 ml/min (HCC) 05/22/2014  . 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 Depression 12/31/2013  . GERD (gastroesophageal reflux disease) 12/31/2013  . Chest pain 05/05/2012  . Hyperlipidemia 05/05/2012  . Malignant hypertension 05/05/2012  . Smoking history 05/05/2012    Past Surgical History:  Procedure Laterality Date  . CEREBRAL  ANGIOGRAM    . MANDIBLE FRACTURE SURGERY    . RENAL ANGIOGRAPHY Left 06/06/2018   Procedure: RENAL ANGIOGRAPHY;  Surgeon: Katha Cabal, MD;  Location: [place] CV LAB;  Service: Cardiovascular;  Laterality: Left;    Prior to Admission medications   Medication Sig Start Date End Date Taking? Authorizing Provider  amLODipine (NORVASC) 10 MG tablet Take 10 mg by mouth daily.     [provider]  aspirin EC 81 MG tablet Take 81 mg by mouth daily.     [provider]  atorvastatin (LIPITOR) 40 MG tablet Take 40 mg by mouth at bedtime. 12/01/17   [provider]  baclofen (LIORESAL) 10 MG tablet Take 10 mg by mouth 2 (two) times daily as needed for muscle spasms.  12/08/17   [provider]  cetirizine (ZYRTEC) 10 MG chewable tablet Chew 10 mg by mouth daily.    [provider]  clopidogrel (PLAVIX) 75 MG tablet Take 1 tablet (75 mg total) by mouth daily. Patient not taking: Reported on 06/22/2018 06/02/18   Kris Hartmann, NP  FLUoxetine (PROZAC) 20 MG capsule Take 20 mg by mouth at bedtime.     [provider]  hydrALAZINE (APRESOLINE) 10 MG tablet Take 1 tablet (10 mg total) by mouth 4 (four) times daily. 08/23/18 08/23/19  Caryn Section, Linden Dolin, PA-C  levETIRAcetam (KEPPRA) 1000 MG tablet Take 1,000 mg by  mouth 2 (two) times daily.     [provider]  LORazepam (ATIVAN) 1 MG tablet Take 1 mg by mouth daily as needed for seizure.  02/28/15   [provider]  losartan (COZAAR) 100 MG tablet Take 100 mg by mouth at bedtime.     [provider]  omeprazole (PRILOSEC) 20 MG capsule Take 20 mg by mouth daily. 10/20/17   [provider]    Allergies Diphenhydramine; Lisinopril; Penicillins; and Shellfish allergy  Family History  Problem Relation Age of Onset  . Hypertension Mother   . Heart disease Father   . Heart attack Father   . Heart attack Brother     Social History Social History   Tobacco Use  .  Smoking status: Former Smoker    Packs/day: 2.00    Years: 20.00    Pack years: 40.00    Types: Cigarettes  . Smokeless tobacco: Never Used  Substance Use Topics  . Alcohol use: No  . Drug use: No    Review of Systems  Constitutional: No fever/chills, elevated blood pressure Eyes: No visual changes. ENT: No sore throat. Respiratory: Denies cough Cardiovascular: Denies chest pain or shortness of breath Genitourinary: Negative for dysuria. Musculoskeletal: Negative for back pain. Skin: Negative for rash.    ____________________________________________   PHYSICAL EXAM:  VITAL SIGNS: ED Triage Vitals  Enc Vitals Group     BP 08/23/18 1539 (!) 171/103     Pulse Rate 08/23/18 1539 77     Resp 08/23/18 1539 18     Temp 08/23/18 1539 98.3 F (36.8 C)     Temp src --      SpO2 08/23/18 1539 98 %     Weight 08/23/18 1540 230 lb (104.3 kg)     Height 08/23/18 1540 '5\' 8"'$  (1.727 m)     Head Circumference --      Peak Flow --      Pain Score 08/23/18 1539 7     Pain Loc --      Pain Edu? --      Excl. in [place]? --     Constitutional: Alert and oriented. Well appearing and in no acute distress. Eyes: Conjunctivae are normal.  Head: Atraumatic. Nose: No congestion/rhinnorhea. Mouth/Throat: Mucous membranes are moist.   Neck:  supple no lymphadenopathy noted Cardiovascular: Normal rate, regular rhythm. Heart sounds are normal Respiratory: Normal respiratory effort.  No retractions, lungs c t a  GU: deferred Musculoskeletal: FROM all extremities, warm and well perfused Neurologic:  Normal speech and language.  Skin:  Skin is warm, dry and intact. No rash noted. Psychiatric: Mood and affect are normal. Speech and behavior are normal.  ____________________________________________   LABS (all labs ordered are listed, but only abnormal results are displayed)  Labs Reviewed  BASIC METABOLIC PANEL  CBC  TROPONIN I   ____________________________________________    ____________________________________________  RADIOLOGY  Chest x-ray is normal  ____________________________________________   PROCEDURES  Procedure(s) performed: Hydralazine 2 mg IV   Procedures    ____________________________________________   INITIAL IMPRESSION / ASSESSMENT AND PLAN / ED COURSE  Pertinent labs & imaging results that were available during my care of the patient were reviewed by me and considered in my medical decision making (see chart for details).   Patient is a 53 year old male presents emergency department with concerns of elevated blood pressure.  He is on losartan and amlodipine.  States his blood pressure at home was 185/105 He denies any chest pain or  shortness of breath.  Physical exam patient appears well.  Blood pressure is elevated.  Remainder the exam is unremarkable.  Hydralazine 2 mg IV.  Blood pressure did decrease to 171/103.  He is to follow-up with cardiology to evaluate him for this elevated blood pressure.  He was given a prescription for hydralazine.  He is to continue his regular medications.  He is to comply with a DASH diet.  Also encouraged him to exercise as this would help lower his blood pressure.  He could also add 1 teaspoon of cinnamon daily or take cinnamon pills.  He states he understands will comply.  He was given strict instructions to return if worsening.  States he understands and was discharged in stable condition.     As part of my medical decision making, I reviewed the following data within the [place] notes reviewed and incorporated, Labs reviewed CBC met B and troponin are all normal, EKG interpreted NSR, Old chart reviewed, Radiograph reviewed chest x-ray is normal, Evaluated by EM attending under Archie Balboa, Notes from prior ED visits and [place] Controlled Substance Database  ____________________________________________   FINAL CLINICAL IMPRESSION(S) / ED DIAGNOSES  Final diagnoses:   Essential hypertension      NEW MEDICATIONS STARTED DURING THIS VISIT:  New Prescriptions   HYDRALAZINE (APRESOLINE) 10 MG TABLET    Take 1 tablet (10 mg total) by mouth 4 (four) times daily.     Note:  This document was prepared using Dragon voice recognition software and may include unintentional dictation errors.    Versie Starks, PA-C 08/23/18 1924    Nance Pear, MD 08/23/18 [phone]

## 2018-08-23 NOTE — ED Provider Notes (Signed)
Spoke to the patient. Appeared in no distress. Discussed importance of follow up with PCP and dietary changes.    Nance Pear, MD 08/23/18 Dorthula Perfect

## 2018-08-23 NOTE — ED Notes (Signed)
Pt resting in bed, A&O x 4, reports headache 8/10.

## 2018-08-23 NOTE — Discharge Instructions (Addendum)
Follow-up with your regular doctor.  Also follow-up with cardiology to address the hypertension.  Return to the emergency department if worsening.  Try 1 teaspoon of cinnamon added to your diet daily.  Exercise daily.

## 2018-08-23 NOTE — ED Triage Notes (Signed)
Pt presents via POV with c/o hypertension. Pt states latest blood pressure was 185/105 at home. Pt c/o headache that began one week ago. Pt has hx of hypertension and currently takes blood pressure medication, but is unsure of the name. Pt also has hx aneurysm and seizures. Pt currently alert and oriented with steady gait in triage.

## 2019-06-01 IMAGING — MR MR HEAD W/O CM
11 of 12 series · 44 of 48 positions shown · non-contrast
Comparison: CT same day.  MRI 01/29/2017.

CLINICAL DATA: Acute presentation with left-sided facial droop.

EXAM:
MRI HEAD WITHOUT CONTRAST
TECHNIQUE: Multiplanar, multiecho pulse sequences of the brain and surrounding
structures were obtained without intravenous contrast.

[Series 2: ax dwi_tracew · axial · 3.0mm · 0.83mm/px · z∈[-50,+112]mm · 6 of 55 slices shown (1 of 2)]
[im 1/55]
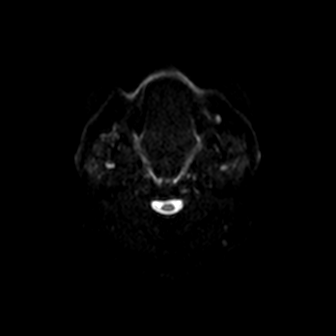
[im 11/55]
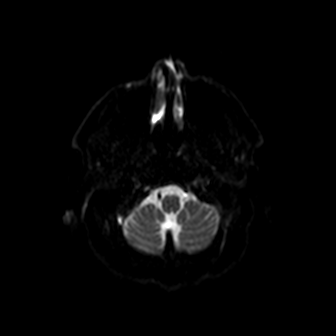
[im 22/55]
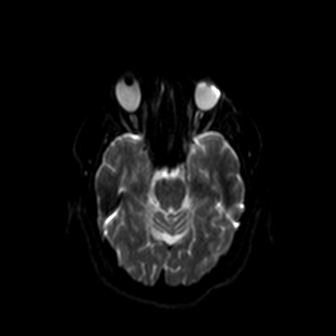
[im 33/55]
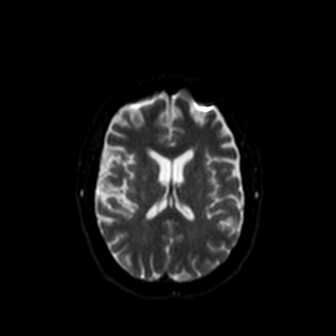
[im 44/55]
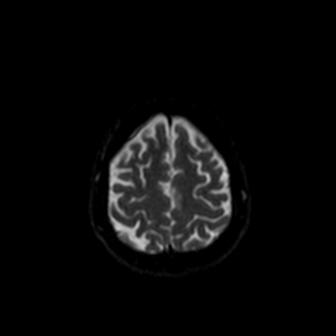
[im 55/55]
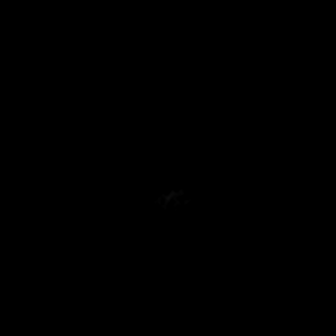

[Series 2: ax dwi_tracew · axial · 3.0mm · 0.83mm/px · z∈[-50,+112]mm · 6 of 55 slices shown (2 of 2)]
[im 1/55]
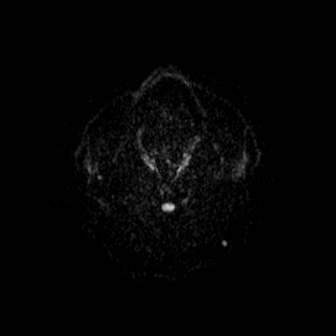
[im 11/55]
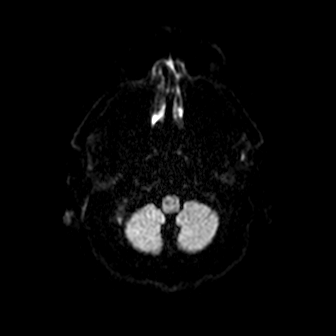
[im 22/55]
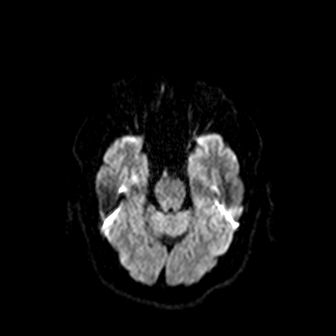
[im 33/55]
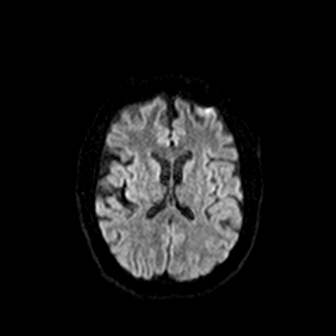
[im 44/55]
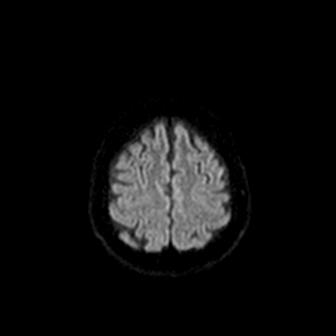
[im 55/55]
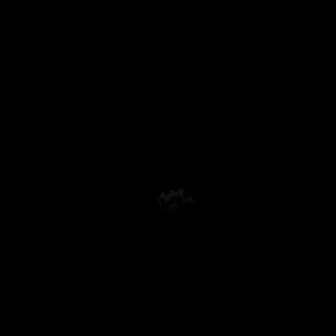

[Series 3: ax dwi_adc · axial · 3.0mm · 0.83mm/px · z∈[-50,+112]mm · 6 of 55 slices shown]
[im 1/55]
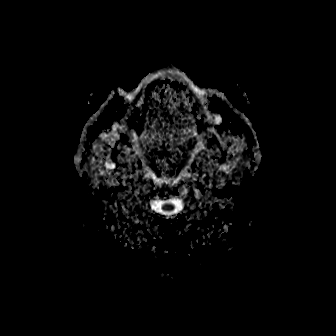
[im 11/55]
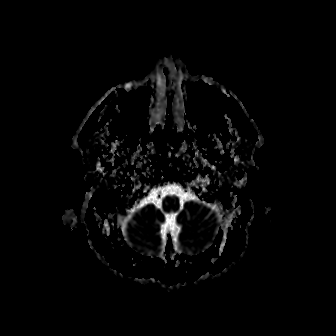
[im 22/55]
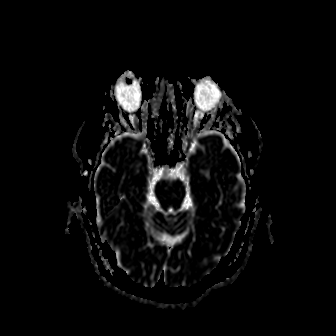
[im 33/55]
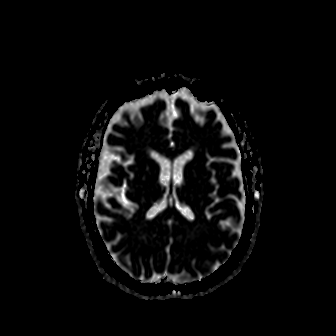
[im 44/55]
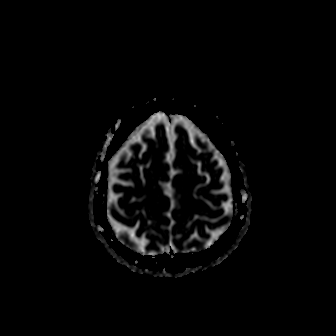
[im 55/55]
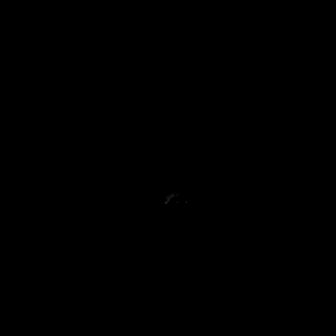

[Series 4: cor dwi_tracew · coronal · 5.0mm · 0.68mm/px · 4 of 38 slices shown (1 of 2)]
[im 1/38]
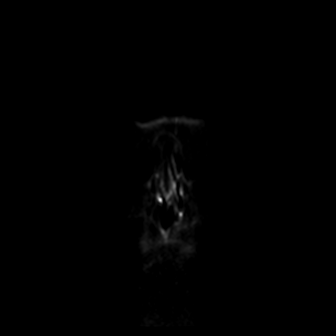
[im 13/38]
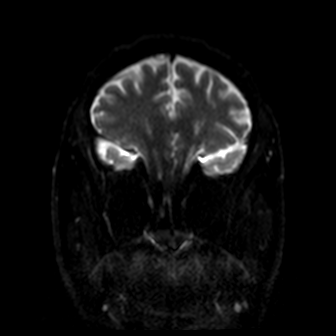
[im 25/38]
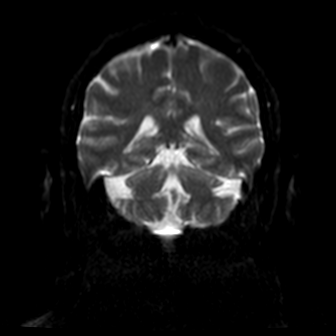
[im 38/38]
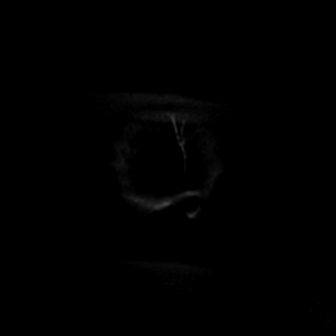

[Series 4: cor dwi_tracew · coronal · 5.0mm · 0.68mm/px · 4 of 38 slices shown (2 of 2)]
[im 1/38]
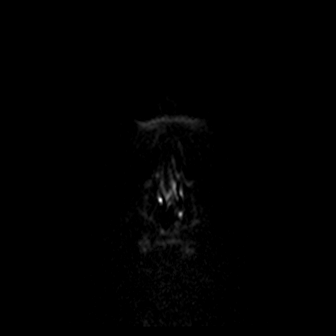
[im 13/38]
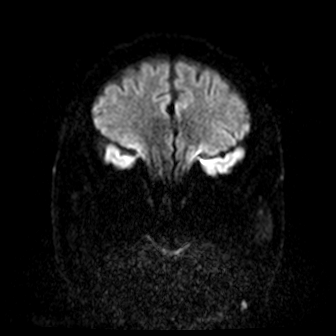
[im 25/38]
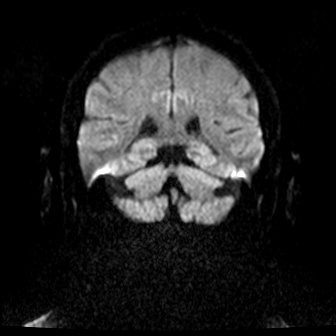
[im 38/38]
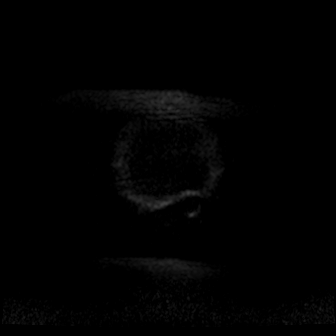

[Series 5: cor dwi_adc · coronal · 5.0mm · 0.68mm/px · 3 of 38 slices shown]
[im 1/38]
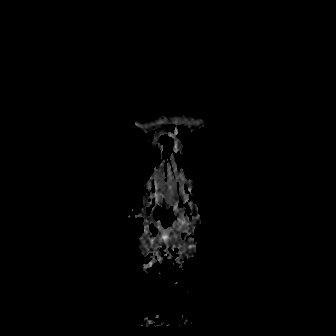
[im 13/38]
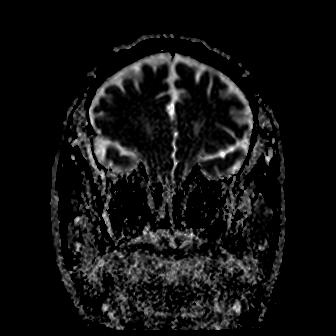
[im 25/38]
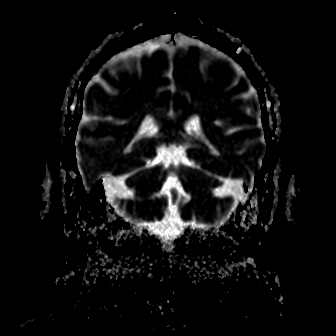

[Series 6: FLAIR · axial · 5.0mm · 1.20mm/px · z∈[-51,+105]mm · 3 of 27 slices shown]
[im 1/27]
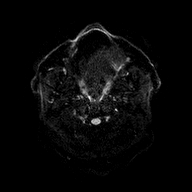
[im 14/27]
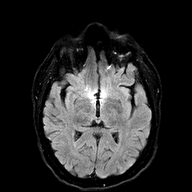
[im 27/27]
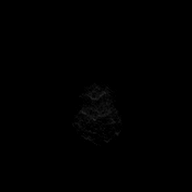

[Series 8: T2 · axial · 5.0mm · 0.45mm/px · z∈[-51,+105]mm · 3 of 27 slices shown (1 of 2)]
[im 1/27]
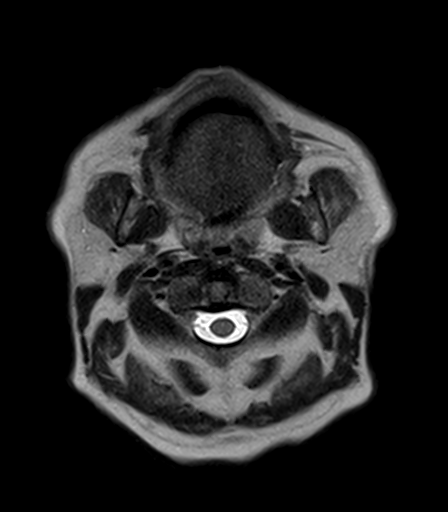
[im 14/27]
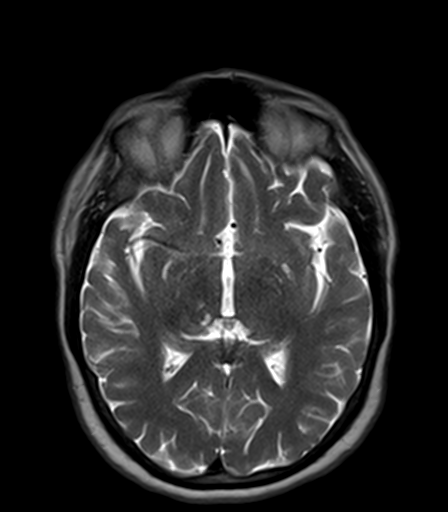
[im 27/27]
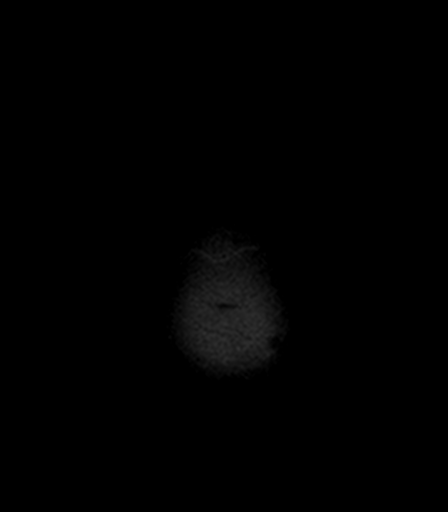

[Series 9: T1 · sagittal · 5.0mm · 0.94mm/px · 3 of 25 slices shown (1 of 2)]
[im 1/25]
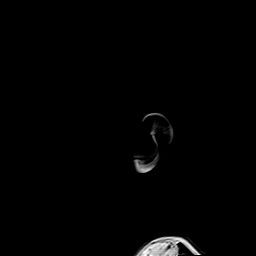
[im 13/25]
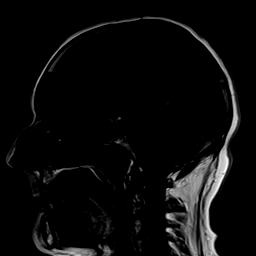
[im 25/25]
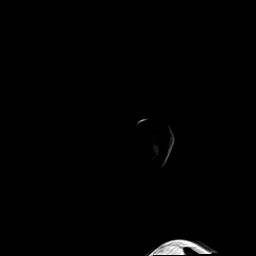

[Series 10: T1 · axial · 5.0mm · 0.90mm/px · z∈[-51,+105]mm · 3 of 27 slices shown (2 of 2)]
[im 1/27]
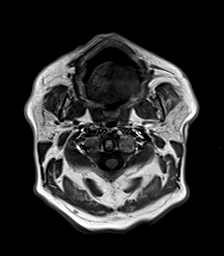
[im 14/27]
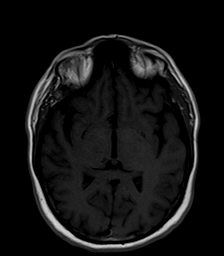
[im 27/27]
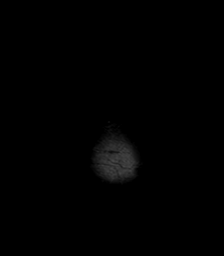

[Series 11: T2 · coronal · 5.0mm · 0.45mm/px · 3 of 31 slices shown (2 of 2)]
[im 1/31]
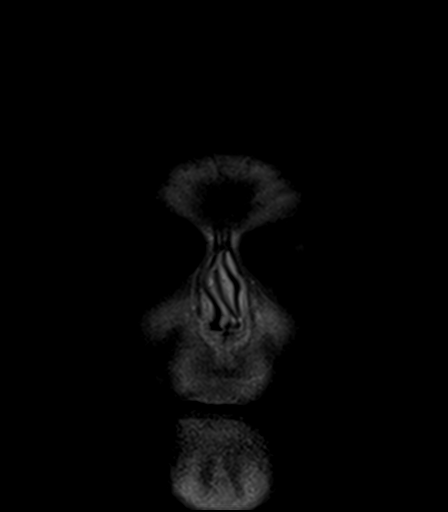
[im 16/31]
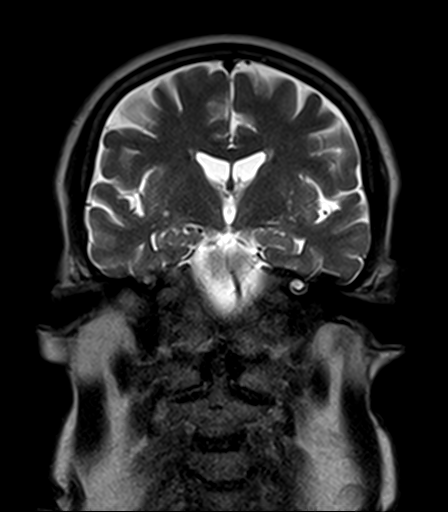
[im 31/31]
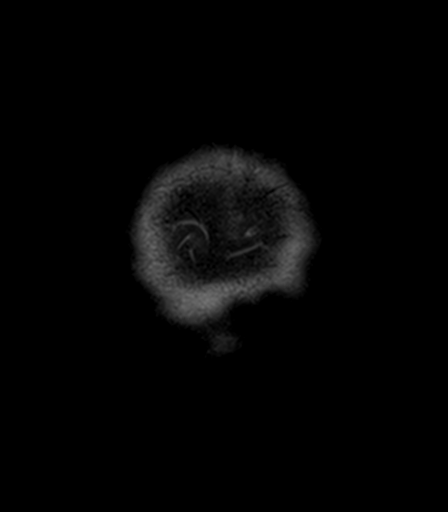

[44 of 48 positions shown; findings below may reference images not displayed]

FINDINGS: Brain: Diffusion imaging does not show any acute or subacute
infarction. The brainstem and cerebellum. There is old infarction
right medial thalamus. Cerebral hemispheres elsewhere show minimal
small vessel change white matter unchanged previous study. No large
vessel territory infarction. Mass lesion, hemorrhage hydrocephalus
or extra-axial collection

Vascular: Major vessels at the base of the brain show flow.

Skull and upper cervical spine: Negative

Sinuses/Orbits: Clear/

Other: None
IMPRESSION: No acute finding. No cause of left facial droop identified. No
change since the previous study. Old infarctions medial right
thalamus. Minimal small vessel change of the hemispheric.

## 2019-10-09 ENCOUNTER — Ambulatory Visit: Payer: Medicare HMO

## 2020-02-27 ENCOUNTER — Other Ambulatory Visit: Payer: Self-pay | Admitting: Family Medicine

## 2020-02-27 ENCOUNTER — Other Ambulatory Visit (HOSPITAL_COMMUNITY): Payer: Self-pay | Admitting: Family Medicine

## 2020-02-27 DIAGNOSIS — R1032 Left lower quadrant pain: Secondary | ICD-10-CM

## 2020-03-04 ENCOUNTER — Other Ambulatory Visit: Payer: Self-pay

## 2020-03-04 ENCOUNTER — Ambulatory Visit
Admission: RE | Admit: 2020-03-04 | Discharge: 2020-03-04 | Disposition: A | Discharge status: Home / Self Care | Payer: Medicare HMO | Source: Ambulatory Visit | Attending: Family Medicine | Admitting: Family Medicine

## 2020-03-04 DIAGNOSIS — R1032 Left lower quadrant pain: Secondary | ICD-10-CM

## 2020-03-04 HISTORY — DX: Disorder of kidney and ureter, unspecified: N28.9

## 2020-03-04 MED ORDER — IOHEXOL 300 MG/ML  SOLN
100.0000 mL | Freq: Once | INTRAMUSCULAR | Status: AC | PRN
  Administered 2020-03-04: 85 mL via INTRAVENOUS

## 2020-06-20 ENCOUNTER — Other Ambulatory Visit: Payer: Self-pay | Admitting: Neurology

## 2020-06-20 ENCOUNTER — Other Ambulatory Visit (HOSPITAL_COMMUNITY): Payer: Self-pay | Admitting: Neurology

## 2020-06-20 DIAGNOSIS — G40219 Localization-related (focal) (partial) symptomatic epilepsy and epileptic syndromes with complex partial seizures, intractable, without status epilepticus: Secondary | ICD-10-CM

## 2020-07-02 ENCOUNTER — Ambulatory Visit
Admission: RE | Admit: 2020-07-02 | Discharge: 2020-07-02 | Disposition: A | Discharge status: Home / Self Care | Payer: Medicare HMO | Source: Ambulatory Visit | Attending: Neurology | Admitting: Neurology

## 2020-07-02 ENCOUNTER — Other Ambulatory Visit: Payer: Self-pay

## 2020-07-02 DIAGNOSIS — G40219 Localization-related (focal) (partial) symptomatic epilepsy and epileptic syndromes with complex partial seizures, intractable, without status epilepticus: Secondary | ICD-10-CM | POA: Diagnosis present

## 2020-08-22 ENCOUNTER — Other Ambulatory Visit: Admission: RE | Admit: 2020-08-22 | Payer: Medicare HMO | Source: Ambulatory Visit

## 2020-08-25 ENCOUNTER — Encounter: Payer: Self-pay | Admitting: *Deleted

## 2020-08-26 ENCOUNTER — Encounter
Admission: RE | Disposition: A | Discharge status: Home / Self Care | Payer: Self-pay | Source: Home / Self Care | Attending: Gastroenterology

## 2020-08-26 ENCOUNTER — Encounter: Payer: Self-pay | Admitting: *Deleted

## 2020-08-26 ENCOUNTER — Ambulatory Visit
Admission: RE | Admit: 2020-08-26 | Discharge: 2020-08-26 | Disposition: A | Discharge status: Home / Self Care | Payer: Medicare HMO | Attending: Gastroenterology | Admitting: Gastroenterology

## 2020-08-26 ENCOUNTER — Ambulatory Visit: Payer: Medicare HMO | Admitting: Anesthesiology

## 2020-08-26 ENCOUNTER — Other Ambulatory Visit: Payer: Self-pay

## 2020-08-26 DIAGNOSIS — Z87891 Personal history of nicotine dependence: Secondary | ICD-10-CM | POA: Insufficient documentation

## 2020-08-26 DIAGNOSIS — Z888 Allergy status to other drugs, medicaments and biological substances status: Secondary | ICD-10-CM | POA: Insufficient documentation

## 2020-08-26 DIAGNOSIS — K6289 Other specified diseases of anus and rectum: Secondary | ICD-10-CM | POA: Insufficient documentation

## 2020-08-26 DIAGNOSIS — D128 Benign neoplasm of rectum: Secondary | ICD-10-CM | POA: Insufficient documentation

## 2020-08-26 DIAGNOSIS — Z7982 Long term (current) use of aspirin: Secondary | ICD-10-CM | POA: Insufficient documentation

## 2020-08-26 DIAGNOSIS — Z880 Allergy status to penicillin: Secondary | ICD-10-CM | POA: Diagnosis not present

## 2020-08-26 DIAGNOSIS — Z79899 Other long term (current) drug therapy: Secondary | ICD-10-CM | POA: Insufficient documentation

## 2020-08-26 DIAGNOSIS — Z7902 Long term (current) use of antithrombotics/antiplatelets: Secondary | ICD-10-CM | POA: Diagnosis not present

## 2020-08-26 DIAGNOSIS — Z8673 Personal history of transient ischemic attack (TIA), and cerebral infarction without residual deficits: Secondary | ICD-10-CM | POA: Diagnosis not present

## 2020-08-26 DIAGNOSIS — Z1211 Encounter for screening for malignant neoplasm of colon: Secondary | ICD-10-CM | POA: Insufficient documentation

## 2020-08-26 DIAGNOSIS — Z91013 Allergy to seafood: Secondary | ICD-10-CM | POA: Insufficient documentation

## 2020-08-26 DIAGNOSIS — Z800 Family history of malignant neoplasm of digestive organs: Secondary | ICD-10-CM | POA: Diagnosis not present

## 2020-08-26 HISTORY — PX: COLONOSCOPY WITH PROPOFOL: SHX5780

## 2020-08-26 SURGERY — COLONOSCOPY WITH PROPOFOL
Anesthesia: General

## 2020-08-26 MED ORDER — PROPOFOL 500 MG/50ML IV EMUL
INTRAVENOUS | Status: AC
  Filled 2020-08-26: qty 50

## 2020-08-26 MED ORDER — SODIUM CHLORIDE 0.9 % IV SOLN: INTRAVENOUS | Status: DC

## 2020-08-26 MED ORDER — PROPOFOL 500 MG/50ML IV EMUL
INTRAVENOUS | Status: DC | PRN
  Administered 2020-08-26: 150 ug/kg/min via INTRAVENOUS

## 2020-08-26 NOTE — Transfer of Care (Signed)
Immediate Anesthesia Transfer of Care Note  Patient: Hunter Weber  Procedure(s) Performed: COLONOSCOPY WITH PROPOFOL (N/A )  Patient Location: PACU  Anesthesia Type:General  Level of Consciousness: awake and sedated  Airway & Oxygen Therapy: Patient connected to face mask oxygen  Post-op Assessment: Report given to RN and Post -op Vital signs reviewed and stable  Post vital signs: Reviewed and stable  Last Vitals:  Vitals Value Taken Time  BP 104/71 08/26/20 0803  Temp 36.2 C 08/26/20 0802  Pulse 68 08/26/20 0803  Resp 14 08/26/20 0803  SpO2 96 % 08/26/20 0803  Vitals shown include unvalidated device data.  Last Pain:  Vitals:   08/26/20 0802  TempSrc: Temporal  PainSc: Asleep         Complications: No complications documented.

## 2020-08-26 NOTE — H&P (Signed)
Outpatient short stay form Pre-procedure 08/26/2020 7:40 AM Raylene Miyamoto MD, MPH  Primary Physician: Dr. Ellison Hughs  Reason for visit:  Screening  History of present illness:   55 y/o gentleman with history of stroke and family history of colon cancer in his father at the age of 84. No abdominal surgeries. Just takes baby aspirin.    Current Facility-Administered Medications:  .  0.9 %  sodium chloride infusion, , Intravenous, Continuous, Gayna Braddy, Hilton Cork, MD, Last Rate: 20 mL/hr at 08/26/20 0721, New Bag at 08/26/20 0721  Medications Prior to Admission  Medication Sig Dispense Refill Last Dose  . amLODipine (NORVASC) 10 MG tablet Take 10 mg by mouth daily.    08/26/2020 at 0600  . aspirin EC 81 MG tablet Take 81 mg by mouth daily.    08/25/2020 at 0800  . atorvastatin (LIPITOR) 40 MG tablet Take 40 mg by mouth at bedtime.  1 08/25/2020 at 0800  . baclofen (LIORESAL) 10 MG tablet Take 10 mg by mouth 2 (two) times daily as needed for muscle spasms.    Past Month at Unknown time  . cetirizine (ZYRTEC) 10 MG chewable tablet Chew 10 mg by mouth daily.   Past Week at Unknown time  . FLUoxetine (PROZAC) 20 MG capsule Take 20 mg by mouth at bedtime.    08/25/2020 at 2000  . levETIRAcetam (KEPPRA) 1000 MG tablet Take 1,000 mg by mouth 2 (two) times daily.    08/26/2020 at 0600  . LORazepam (ATIVAN) 1 MG tablet Take 1 mg by mouth daily as needed for seizure.    Past Month at Unknown time  . losartan (COZAAR) 100 MG tablet Take 100 mg by mouth at bedtime.    08/25/2020 at 2000  . omeprazole (PRILOSEC) 20 MG capsule Take 20 mg by mouth daily.  1 08/26/2020 at 0600  . clopidogrel (PLAVIX) 75 MG tablet Take 1 tablet (75 mg total) by mouth daily. 30 tablet 11   . hydrALAZINE (APRESOLINE) 10 MG tablet Take 1 tablet (10 mg total) by mouth 4 (four) times daily. 120 tablet 0      Allergies  Allergen Reactions  . Diphenhydramine Other (See Comments)    Induces seizures  . Lisinopril   . Penicillins  Other (See Comments)    Has patient had a PCN reaction causing immediate rash, facial/tongue/throat swelling, SOB or lightheadedness with hypotension: Unknown Has patient had a PCN reaction causing severe rash involving mucus membranes or skin necrosis: Unknown Has patient had a PCN reaction that required hospitalization: Unknown Has patient had a PCN reaction occurring within the last 10 years: Yes If all of the above answers are "NO", then may proceed with Cephalosporin use.   Marland Kitchen Shellfish Allergy      Past Medical History:  Diagnosis Date  . Bell's palsy   . Brain aneurysm    per patient report   . Epilepsy ([place])   . GERD (gastroesophageal reflux disease)   . History of depression   . Hyperlipidemia   . Hypertension   . Low libido   . Migraines   . Other convulsions   . Positive ANA (antinuclear antibody)   . Renal insufficiency   . Seizure disorder ([place])   . Stroke [facility])     Review of systems:  Otherwise negative.    Physical Exam  Gen: Alert, oriented. Appears stated age.  HEENT: PERRLA. Lungs: No respiratory distress CV: RRR Abd: soft, benign, no masses Ext: No edema    Planned procedures:  Proceed with colonoscopy. The patient understands the nature of the planned procedure, indications, risks, alternatives and potential complications including but not limited to bleeding, infection, perforation, damage to internal organs and possible oversedation/side effects from anesthesia. The patient agrees and gives consent to proceed.  Please refer to procedure notes for findings, recommendations and patient disposition/instructions.     Raylene Miyamoto MD, MPH Gastroenterology 08/26/2020  7:40 AM

## 2020-08-26 NOTE — Op Note (Signed)
[facility] Gastroenterology Patient Name: Hunter Weber Procedure Date: 08/26/2020 7:18 AM MRN: [id number] Account #: [id number] Date of Birth: Feb 04, 1966 Admit Type: Outpatient Age: 55 Room: [facility] ENDO ROOM 1 Gender: Male Note Status: Finalized Procedure:             Colonoscopy Indications:           Screening patient at increased risk: Family history of                         1st-degree relative with colorectal cancer at age 76                         years (or older) Providers:             Andrey Farmer MD, MD Referring MD:          Sofie Hartigan (Referring MD) Medicines:             Monitored Anesthesia Care Complications:         No immediate complications. Estimated blood loss:                         Minimal. Procedure:             Pre-Anesthesia Assessment:                        - Prior to the procedure, a History and Physical was                         performed, and patient medications and allergies were                         reviewed. The patient is competent. The risks and                         benefits of the procedure and the sedation options and                         risks were discussed with the patient. All questions                         were answered and informed consent was obtained.                         Patient identification and proposed procedure were                         verified by the physician, the nurse, the anesthetist                         and the technician in the endoscopy suite. Mental                         Status Examination: alert and oriented. Airway                         Examination: normal oropharyngeal airway and neck  mobility. Respiratory Examination: clear to                         auscultation. CV Examination: normal. Prophylactic                         Antibiotics: The patient does not require prophylactic                         antibiotics. Prior Anticoagulants:  The patient has                         taken no previous anticoagulant or antiplatelet agents                         except for aspirin. ASA Grade Assessment: III - A                         patient with severe systemic disease. After reviewing                         the risks and benefits, the patient was deemed in                         satisfactory condition to undergo the procedure. The                         anesthesia plan was to use monitored anesthesia care                         (MAC). Immediately prior to administration of                         medications, the patient was re-assessed for adequacy                         to receive sedatives. The heart rate, respiratory                         rate, oxygen saturations, blood pressure, adequacy of                         pulmonary ventilation, and response to care were                         monitored throughout the procedure. The physical                         status of the patient was re-assessed after the                         procedure.                        After obtaining informed consent, the colonoscope was                         passed under direct vision. Throughout the procedure,  the patient's blood pressure, pulse, and oxygen                         saturations were monitored continuously. The                         Colonoscope was introduced through the anus and                         advanced to the the cecum, identified by appendiceal                         orifice and ileocecal valve. The colonoscopy was                         performed without difficulty. The patient tolerated                         the procedure well. The quality of the bowel                         preparation was good. Findings:      The perianal and digital rectal examinations were normal.      A 1 mm polyp was found in the rectum. The polyp was sessile. The polyp       was removed with a jumbo cold  forceps. Resection and retrieval were       complete. Estimated blood loss was minimal.      Anal papilla(e) were hypertrophied.      The exam was otherwise without abnormality on direct and retroflexion       views. Impression:            - One 1 mm polyp in the rectum, removed with a jumbo                         cold forceps. Resected and retrieved.                        - Anal papilla(e) were hypertrophied.                        - The examination was otherwise normal on direct and                         retroflexion views. Recommendation:        - Discharge patient to home.                        - Resume previous diet.                        - Continue present medications.                        - Await pathology results.                        - Repeat colonoscopy in 7 years for surveillance.                        -  Return to referring physician as previously                         scheduled. Procedure Code(s):     --- Professional ---                        [phone], Colonoscopy, flexible; with biopsy, single or                         multiple Diagnosis Code(s):     --- Professional ---                        Z80.0, Family history of malignant neoplasm of                         digestive organs                        K62.1, Rectal polyp                        K62.89, Other specified diseases of anus and rectum CPT copyright 2019 [employer] Association. All rights reserved. The codes documented in this report are preliminary and upon coder review may  be revised to meet current compliance requirements. Andrey Farmer MD, MD 08/26/2020 8:03:31 AM Number of Addenda: 0 Note Initiated On: 08/26/2020 7:18 AM Scope Withdrawal Time: 0 hours 8 minutes 32 seconds  Total Procedure Duration: 0 hours 12 minutes 13 seconds  Estimated Blood Loss:  Estimated blood loss was minimal.      [facility]

## 2020-08-26 NOTE — Interval H&P Note (Signed)
History and Physical Interval Note:  08/26/2020 7:43 AM  Hunter Weber  has presented today for surgery, with the diagnosis of family history of colon cancer.  The various methods of treatment have been discussed with the patient and family. After consideration of risks, benefits and other options for treatment, the patient has consented to  Procedure(s): COLONOSCOPY WITH PROPOFOL (N/A) as a surgical intervention.  The patient's history has been reviewed, patient examined, no change in status, stable for surgery.  I have reviewed the patient's chart and labs.  Questions were answered to the patient's satisfaction.     Lesly Rubenstein  Ok to proceed with colonoscopy

## 2020-08-26 NOTE — Anesthesia Preprocedure Evaluation (Addendum)
Anesthesia Evaluation  Patient identified by MRN, date of birth, ID band Patient awake    Reviewed: Allergy & Precautions, NPO status , Patient's Chart, lab work & pertinent test results  Airway Mallampati: II  TM Distance: >3 FB     Dental  (+) Upper Dentures, Lower Dentures   Pulmonary neg pulmonary ROS, former smoker,    Pulmonary exam normal        Cardiovascular hypertension, Normal cardiovascular exam     Neuro/Psych  Headaches, Seizures -,  PSYCHIATRIC DISORDERS Depression  Neuromuscular disease CVA    GI/Hepatic GERD  ,  Endo/Other  negative endocrine ROS  Renal/GU Renal disease  negative genitourinary   Musculoskeletal negative musculoskeletal ROS (+)   Abdominal Normal abdominal exam  (+)   Peds negative pediatric ROS (+)  Hematology negative hematology ROS (+)   Anesthesia Other Findings Past Medical History: No date: Bell's palsy No date: Brain aneurysm     Comment:  per patient report  No date: Epilepsy ([place]) No date: GERD (gastroesophageal reflux disease) No date: History of depression No date: Hyperlipidemia No date: Hypertension No date: Low libido No date: Migraines No date: Other convulsions No date: Positive ANA (antinuclear antibody) No date: Renal insufficiency No date: Seizure disorder ([place]) No date: Stroke [facility])  Reproductive/Obstetrics                            Anesthesia Physical Anesthesia Plan  ASA: III  Anesthesia Plan: General   Post-op Pain Management:    Induction:   PONV Risk Score and Plan: Propofol infusion  Airway Management Planned: Nasal Cannula  Additional Equipment:   Intra-op Plan:   Post-operative Plan:   Informed Consent: I have reviewed the patients History and Physical, chart, labs and discussed the procedure including the risks, benefits and alternatives for the proposed anesthesia with the patient or authorized  representative who has indicated his/her understanding and acceptance.     Dental advisory given  Plan Discussed with: CRNA  Anesthesia Plan Comments:         Anesthesia Quick Evaluation

## 2020-08-26 NOTE — Anesthesia Procedure Notes (Signed)
Performed by: Vaughan Sine Pre-anesthesia Checklist: Patient identified, Suction available, Emergency Drugs available, Patient being monitored and Timeout performed Patient Re-evaluated:Patient Re-evaluated prior to induction Oxygen Delivery Method: Simple face mask Preoxygenation: Pre-oxygenation with 100% oxygen Induction Type: IV induction Placement Confirmation: positive ETCO2 and CO2 detector

## 2020-08-26 NOTE — Anesthesia Postprocedure Evaluation (Signed)
Anesthesia Post Note  Patient: Hunter Weber  Procedure(s) Performed: COLONOSCOPY WITH PROPOFOL (N/A )  Patient location during evaluation: Endoscopy Anesthesia Type: General Level of consciousness: awake and alert and oriented Pain management: pain level controlled Vital Signs Assessment: post-procedure vital signs reviewed and stable Respiratory status: spontaneous breathing Cardiovascular status: blood pressure returned to baseline Anesthetic complications: no   No complications documented.   Last Vitals:  Vitals:   08/26/20 0822 08/26/20 0832  BP: (!) 141/94 (!) 141/95  Pulse:    Resp:  18  Temp:    SpO2:      Last Pain:  Vitals:   08/26/20 0832  TempSrc:   PainSc: 0-No pain                 Katie Faraone

## 2020-08-27 ENCOUNTER — Encounter: Payer: Self-pay | Admitting: Gastroenterology

## 2020-08-27 LAB — SURGICAL PATHOLOGY

## 2022-06-25 ENCOUNTER — Ambulatory Visit (INDEPENDENT_AMBULATORY_CARE_PROVIDER_SITE_OTHER): Payer: Medicare HMO

## 2022-06-25 ENCOUNTER — Ambulatory Visit
Admission: EM | Admit: 2022-06-25 | Discharge: 2022-06-25 | Disposition: A | Discharge status: Home / Self Care | Payer: Medicare HMO | Attending: Family Medicine | Admitting: Family Medicine

## 2022-06-25 DIAGNOSIS — M5010 Cervical disc disorder with radiculopathy, unspecified cervical region: Secondary | ICD-10-CM | POA: Diagnosis not present

## 2022-06-25 DIAGNOSIS — M542 Cervicalgia: Secondary | ICD-10-CM

## 2022-06-25 MED ORDER — CYCLOBENZAPRINE HCL 10 MG PO TABS: 10.0000 mg | ORAL_TABLET | Freq: Three times a day (TID) | ORAL | 0 refills | Status: AC | PRN

## 2022-06-25 MED ORDER — GABAPENTIN 300 MG PO CAPS: 300.0000 mg | ORAL_CAPSULE | Freq: Three times a day (TID) | ORAL | 0 refills | Status: AC

## 2022-06-25 NOTE — Discharge Instructions (Addendum)
You have some narrowing of the nerve spaces in the middle of your neck.  You also have  slight movement of the bones backwards in this area.  This may be the cause of your pain.   Stop by the pharmacy to pick up your prescriptions.  Follow up with your primary care or orthopedic provider as needed. You may need a CT or MRI in the future.

## 2022-06-25 NOTE — ED Provider Notes (Signed)
[facility]    CSN: [id number] Arrival date & time: 06/25/22  1734      History   Chief Complaint Chief Complaint  Patient presents with   Back Pain    HPI  HPI Hunter Weber is a 57 y.o. male.   Hunter Weber presents for upper back pain and neck pain since waking up on Thursday.  Pain is getting worse. No heavy lifting or pulling. Sleeps on his left side with 1 pillow. Has sharp stabbing pain that radiates to his upper back. No trauma or recent fall.  Took Baclofen but that didn't help.    low back pain that started *** days ago after ***?injury. Pain is described as *** {back pain desc:31852} and {back pain radiation:11559}. Pain rated ***/10.  Endorses {ed back pain sx:10963}. Denies {ed back pain sx:10963}.   Continues to have *** description pain with movement. Hunter Weber ***does not feel like his*** legs are weak.   Has ***never injured his/her*** back before.   Has tried {home care:60200} with {relief:12621}.  ***No change in pain day vs night.    Red flags*** Perianal numbness, cancer, unexplained weight loss, immunosuppression, prolonged use of steroids, history of IV drug use, pain that is increased or unrelieved by rest, fever, bladder or bowel incontinence, significant trauma related to age   Fever : no  Weight loss: *** Perianal numbness: *** Bowel incontinence: *** Bladder incontinence: *** Trauma: ***  Sore throat: no   Cough: no Hydration: normal  Abdominal pain: no Nausea: no Vomiting: no Abdominal pain: *** Dysuria:*** Sleep disturbance: no *** Neck Pain: no Headache: no     Past Medical History:  Diagnosis Date   Bell's palsy    Brain aneurysm    per patient report    Epilepsy ([place])    GERD (gastroesophageal reflux disease)    History of depression    Hyperlipidemia    Hypertension    Low libido    Migraines    Other convulsions    Positive ANA (antinuclear antibody)    Renal insufficiency    Seizure disorder ([place])    Stroke [facility])      Patient Active Problem List   Diagnosis Date Noted   Acute ischemic right middle cerebral artery (MCA) stroke ([place]) 08/29/2014   Aneurysm of middle cerebral artery 08/29/2014   CKD (chronic kidney disease) stage 3, GFR 30-59 ml/min ([place]) 05/22/2014   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[place]) 12/31/2013   Depression 12/31/2013   GERD (gastroesophageal reflux disease) 12/31/2013   Chest pain 05/05/2012   Hyperlipidemia 05/05/2012   Malignant hypertension 05/05/2012   Smoking history 05/05/2012    Past Surgical History:  Procedure Laterality Date   CEREBRAL ANGIOGRAM     COLONOSCOPY N/A    COLONOSCOPY WITH PROPOFOL N/A 08/26/2020   Procedure: COLONOSCOPY WITH PROPOFOL;  Surgeon: Lesly Rubenstein, MD;  Location: [facility] ENDOSCOPY;  Service: Endoscopy;  Laterality: N/A;   MANDIBLE FRACTURE SURGERY     RENAL ANGIOGRAPHY Left 06/06/2018   Procedure: RENAL ANGIOGRAPHY;  Surgeon: Katha Cabal, MD;  Location: [facility];  Service: Cardiovascular;  Laterality: Left;       Home Medications    Prior to Admission medications   Medication Sig Start Date End Date Taking? Authorizing Provider  amLODipine (NORVASC) 10 MG tablet Take 10 mg by mouth daily.    Yes [provider]  aspirin EC 81 MG tablet Take 81 mg by mouth daily.    Yes [provider]  atorvastatin (LIPITOR) 40 MG tablet Take 40 mg by mouth at bedtime. 12/01/17  Yes [provider]  baclofen (LIORESAL) 10 MG tablet Take 10 mg by mouth 2 (two) times daily as needed for muscle spasms.  12/08/17  Yes [provider]  levETIRAcetam (KEPPRA) 1000 MG tablet Take 1,000 mg by mouth 2 (two) times daily.    Yes [provider]  losartan (COZAAR) 100 MG tablet Take 100 mg by mouth at bedtime.    Yes [provider]  pantoprazole (PROTONIX) 40 MG tablet Take 40 mg by mouth daily.   Yes [provider]  cetirizine (ZYRTEC) 10 MG chewable tablet  Chew 10 mg by mouth daily.    [provider]  clopidogrel (PLAVIX) 75 MG tablet Take 1 tablet (75 mg total) by mouth daily. 06/02/18   Kris Hartmann, NP  FLUoxetine (PROZAC) 20 MG capsule Take 20 mg by mouth at bedtime.     [provider]  hydrALAZINE (APRESOLINE) 10 MG tablet Take 1 tablet (10 mg total) by mouth 4 (four) times daily. 08/23/18 08/23/19  Fisher, Linden Dolin, PA-C  LORazepam (ATIVAN) 1 MG tablet Take 1 mg by mouth daily as needed for seizure.  02/28/15   [provider]  omeprazole (PRILOSEC) 20 MG capsule Take 20 mg by mouth daily. 10/20/17   [provider]    Family History Family History  Problem Relation Age of Onset   Hypertension Mother    Heart disease Father    Heart attack Father    Heart attack Brother     Social History Social History   Tobacco Use   Smoking status: Former    Packs/day: 2.00    Years: 20.00    Total pack years: 40.00    Types: Cigarettes   Smokeless tobacco: Never  Vaping Use   Vaping Use: Never used  Substance Use Topics   Alcohol use: No   Drug use: No     Allergies   Diphenhydramine, Lisinopril, Penicillins, and Shellfish allergy   Review of Systems Review of Systems: egative unless otherwise stated in HPI.      Physical Exam Triage Vital Signs ED Triage Vitals  Enc Vitals Group     BP 06/25/22 1752 (!) 142/100     Pulse Rate 06/25/22 1752 75     Resp --      Temp 06/25/22 1752 98.2 F (36.8 C)     Temp Source 06/25/22 1752 Oral     SpO2 06/25/22 1752 99 %     Weight 06/25/22 1748 227 lb (103 kg)     Height 06/25/22 1748 '5\' 8"'$  (1.727 m)     Head Circumference --      Peak Flow --      Pain Score 06/25/22 1747 6     Pain Loc --      Pain Edu? --      Excl. in [place]? --    No data found.  Updated Vital Signs BP (!) 142/100 (BP Location: Left Arm)   Pulse 75   Temp 98.2 F (36.8 C) (Oral)   Ht '5\' 8"'$  (1.727 m)   Wt 103 kg   SpO2 99%   BMI 34.52 kg/m   Visual Acuity Right  Eye Distance:   Left Eye Distance:   Bilateral Distance:    Right Eye Near:   Left Eye Near:    Bilateral Near:     Physical Exam GEN: well appearing male  in no acute distress  CVS: well perfused  RESP: speaking in full sentences without pause, no respiratory distress  MSK:  Lumbar spine: - Inspection: no gross deformity or asymmetry, swelling or ecchymosis. No skin changes  - Palpation: No TTP over the spinous processes, ***bilateral lumbar paraspinal muscles, no SI joint tenderness bilaterally - ROM: full active ROM of the lumbar spine in flexion and extension but with mild pain ***with flexion/extension  - Strength: 5/5 strength of lower extremity in L4-S1 nerve root distributions b/l - Neuro: sensation intact in the L4-S1 nerve root distribution b/l, ***2+ L4 and S1 reflexes - Special testing: Negative straight leg raise SKIN: warm, dry, no overly skin rash or erythema    UC Treatments / Results  Labs (all labs ordered are listed, but only abnormal results are displayed) Labs Reviewed - No data to display  EKG   Radiology No results found.  Procedures Procedures (including critical care time)  Medications Ordered in UC Medications - No data to display  Initial Impression / Assessment and Plan / UC Course  I have reviewed the triage vital signs and the nursing notes.  Pertinent labs & imaging results that were available during my care of the patient were reviewed by me and considered in my medical decision making (see chart for details).      Pt is a 57 y.o.  male with *** days of *** back pain after ***.  Obtained *** plain films.  Exam is concerning for a ***.   Xray personally reviewed by me were ***unremarkable for fracture, ***malalignment or dislocation.  Radiologist agrees***/notes ***  Patient to gradually return to normal activities, as tolerated and continue ordinary activities within the limits permitted by pain. Prescribed Naproxen sodium *** and  muscle relaxer *** for pain relief.  Advised patient to avoid other NSAIDs while taking ***Naprosyn. Tylenol and Lidocaine patches PRN for multimodal pain relief. Counseled patient on red flag symptoms and when to seek immediate care.  ***No red flags suggesting cauda equina syndrome or progressive major motor weakness. Patient to follow up with orthopedic provider if symptoms do not improve with conservative treatment.  Return and ED precautions given.    Discussed MDM, treatment plan and plan for follow-up with patient/parent who agrees with plan.   Final Clinical Impressions(s) / UC Diagnoses   Final diagnoses:  None   Discharge Instructions   None    ED Prescriptions   None    PDMP not reviewed this encounter.

## 2022-06-25 NOTE — ED Triage Notes (Signed)
Pt c/o upper back pain x2days  Pt states that he woke up yesterday morning with upper mid back pain and he can not move his neck and has pain when moving his arms.  Pt states it feels like a sharp knife is in his spine and being twisted.  Pt states that he has a aneurism 5 years ago.
